# Patient Record
Sex: Female | Born: 1970
Health system: Southern US, Community
[De-identification: ages and names within clinical notes are randomized; demographics above are authoritative.]

## PROBLEM LIST (undated history)

## (undated) DIAGNOSIS — Z8669 Personal history of other diseases of the nervous system and sense organs: Secondary | ICD-10-CM

## (undated) DIAGNOSIS — F329 Major depressive disorder, single episode, unspecified: Secondary | ICD-10-CM

## (undated) DIAGNOSIS — F32A Depression, unspecified: Secondary | ICD-10-CM

## (undated) DIAGNOSIS — F419 Anxiety disorder, unspecified: Secondary | ICD-10-CM

## (undated) DIAGNOSIS — T7840XA Allergy, unspecified, initial encounter: Secondary | ICD-10-CM

## (undated) HISTORY — DX: Major depressive disorder, single episode, unspecified: F32.9

## (undated) HISTORY — DX: Depression, unspecified: F32.A

## (undated) HISTORY — PX: BASAL CELL CARCINOMA EXCISION: SHX1214

## (undated) HISTORY — DX: Allergy, unspecified, initial encounter: T78.40XA

## (undated) HISTORY — PX: WISDOM TOOTH EXTRACTION: SHX21

---

## 1996-11-20 HISTORY — PX: WISDOM TOOTH EXTRACTION: SHX21

## 2005-06-14 ENCOUNTER — Other Ambulatory Visit: Admission: RE | Admit: 2005-06-14 | Discharge: 2005-06-14 | Payer: Self-pay | Admitting: Family Medicine

## 2006-07-17 ENCOUNTER — Other Ambulatory Visit: Admission: RE | Admit: 2006-07-17 | Discharge: 2006-07-17 | Payer: Self-pay | Admitting: Family Medicine

## 2007-08-05 ENCOUNTER — Other Ambulatory Visit: Admission: RE | Admit: 2007-08-05 | Discharge: 2007-08-05 | Payer: Self-pay | Admitting: Family Medicine

## 2008-09-02 ENCOUNTER — Other Ambulatory Visit: Admission: RE | Admit: 2008-09-02 | Discharge: 2008-09-02 | Payer: Self-pay | Admitting: Family Medicine

## 2011-09-18 ENCOUNTER — Other Ambulatory Visit (HOSPITAL_COMMUNITY)
Admission: RE | Admit: 2011-09-18 | Discharge: 2011-09-18 | Disposition: A | Discharge status: Home / Self Care | Payer: BC Managed Care – PPO | Source: Ambulatory Visit | Attending: Family Medicine | Admitting: Family Medicine

## 2011-09-18 ENCOUNTER — Other Ambulatory Visit: Payer: Self-pay | Admitting: Family Medicine

## 2011-09-18 DIAGNOSIS — Z1159 Encounter for screening for other viral diseases: Secondary | ICD-10-CM | POA: Insufficient documentation

## 2011-09-18 DIAGNOSIS — Z124 Encounter for screening for malignant neoplasm of cervix: Secondary | ICD-10-CM | POA: Insufficient documentation

## 2014-03-12 ENCOUNTER — Telehealth: Payer: Self-pay

## 2014-03-12 NOTE — Telephone Encounter (Signed)
Unable to reach patient.  Invalid number.

## 2014-03-13 ENCOUNTER — Other Ambulatory Visit (HOSPITAL_COMMUNITY)
Admission: RE | Admit: 2014-03-13 | Discharge: 2014-03-13 | Disposition: A | Discharge status: Home / Self Care | Payer: BC Managed Care – PPO | Source: Ambulatory Visit | Attending: Family Medicine | Admitting: Family Medicine

## 2014-03-13 ENCOUNTER — Ambulatory Visit (INDEPENDENT_AMBULATORY_CARE_PROVIDER_SITE_OTHER): Payer: BC Managed Care – PPO | Admitting: Family Medicine

## 2014-03-13 ENCOUNTER — Encounter: Payer: Self-pay | Admitting: Family Medicine

## 2014-03-13 VITALS — BP 120/78 | HR 86 | Temp 98.1°F | Resp 16 | Ht 63.75 in | Wt 141.4 lb

## 2014-03-13 DIAGNOSIS — F341 Dysthymic disorder: Secondary | ICD-10-CM

## 2014-03-13 DIAGNOSIS — Z0000 Encounter for general adult medical examination without abnormal findings: Secondary | ICD-10-CM | POA: Insufficient documentation

## 2014-03-13 DIAGNOSIS — Z1231 Encounter for screening mammogram for malignant neoplasm of breast: Secondary | ICD-10-CM

## 2014-03-13 DIAGNOSIS — Z124 Encounter for screening for malignant neoplasm of cervix: Secondary | ICD-10-CM | POA: Insufficient documentation

## 2014-03-13 DIAGNOSIS — F329 Major depressive disorder, single episode, unspecified: Secondary | ICD-10-CM

## 2014-03-13 DIAGNOSIS — F32A Depression, unspecified: Secondary | ICD-10-CM

## 2014-03-13 DIAGNOSIS — Z01419 Encounter for gynecological examination (general) (routine) without abnormal findings: Secondary | ICD-10-CM

## 2014-03-13 DIAGNOSIS — F419 Anxiety disorder, unspecified: Secondary | ICD-10-CM

## 2014-03-13 DIAGNOSIS — Z1151 Encounter for screening for human papillomavirus (HPV): Secondary | ICD-10-CM | POA: Insufficient documentation

## 2014-03-13 MED ORDER — FLUOXETINE HCL 20 MG PO TABS: 20.0000 mg | ORAL_TABLET | Freq: Every day | ORAL | Status: DC

## 2014-03-13 NOTE — Progress Notes (Signed)
   Subjective:    Patient ID: Sierra Harris, female    DOB: Aug 24, 1971, 43 y.o.   MRN: [id number]  HPI New to establish.  [facility]([place] then [facility] [employer] Sigmund Hazel(Lisa Miller)  CPE- last CPE 2012.  Due to start mammos.   Review of Systems Patient reports no vision/ hearing changes, adenopathy,fever, weight change,  persistant/recurrent hoarseness , swallowing issues, chest pain, palpitations, edema, persistant/recurrent cough, hemoptysis, dyspnea (rest/exertional/paroxysmal nocturnal), gastrointestinal bleeding (melena, rectal bleeding), abdominal pain, significant heartburn, bowel changes, GU symptoms (dysuria, hematuria, incontinence), Gyn symptoms (abnormal  bleeding, pain),  syncope, focal weakness, memory loss, numbness & tingling, skin/hair/nail changes, abnormal bruising or bleeding.  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  Has supportive boyfriend.  Exercising regularly.    Objective:   Physical Exam  General Appearance:    Alert, cooperative, no distress, appears stated age  Head:    Normocephalic, without obvious abnormality, atraumatic  Eyes:    PERRL, conjunctiva/corneas clear, EOM's intact, fundi    benign, both eyes  Ears:    Normal TM's and external ear canals, both ears  Nose:   Nares normal, septum midline, mucosa normal, no drainage    or sinus tenderness  Throat:   Lips, mucosa, and tongue normal; teeth and gums normal  Neck:   Supple, symmetrical, trachea midline, no adenopathy;    Thyroid: no enlargement/tenderness/nodules  Back:     Symmetric, no curvature, ROM normal, no CVA tenderness  Lungs:     Clear to auscultation bilaterally, respirations unlabored  Chest Wall:    No tenderness or deformity   Heart:    Regular rate and rhythm, S1 and S2 normal, no murmur, rub   or gallop  Breast Exam:    No tenderness, masses, or nipple  abnormality  Abdomen:     Soft, non-tender, bowel sounds active all four quadrants,    no masses, no organomegaly  Genitalia:    External genitalia normal, cervix normal in appearance, no CMT, uterus in normal size and position, adnexa w/out mass or tenderness, mucosa pink and moist, no lesions or discharge present  Rectal:    Normal external appearance  Extremities:   Extremities normal, atraumatic, no cyanosis or edema  Pulses:   2+ and symmetric all extremities  Skin:   Skin color, texture, turgor normal, no rashes or lesions  Lymph nodes:   Cervical, supraclavicular, and axillary nodes normal  Neurologic:   CNII-XII intact, normal strength, sensation and reflexes    throughout          Assessment & Plan:

## 2014-03-13 NOTE — Assessment & Plan Note (Signed)
Pt's PE WNL w/ exception of anxiety/depression.  Refer for mammo.  Check labs.  Anticipatory guidance provided.

## 2014-03-13 NOTE — Assessment & Plan Note (Signed)
Pap collected. 

## 2014-03-13 NOTE — Assessment & Plan Note (Signed)
New.  Pt already managing w/ lifestyle modifications- exercise, speaking w/ supportive partner- open to idea of medication.  Will start low dose SSRI and monitor for improvement.  Pt expressed understanding and is in agreement w/ plan.

## 2014-03-13 NOTE — Progress Notes (Signed)
Pre visit review using our clinic review tool, if applicable. No additional management support is needed unless otherwise documented below in the visit note. 

## 2014-03-13 NOTE — Patient Instructions (Signed)
Follow up in 4-6 weeks to recheck mood Start the Prozac daily- if it makes you sleepy (odd effect but possible), take it at night We'll notify you of your lab results and make any changes if needed We'll call you to set up your mammo appt Call with any questions or concerns Welcome!  We're glad to have you!!

## 2014-03-13 NOTE — Addendum Note (Signed)
Addended by: Silvio PateHOMPSON, Klee Kolek D on: 03/13/2014 10:56 AM   Modules accepted: Orders

## 2014-03-16 ENCOUNTER — Other Ambulatory Visit (INDEPENDENT_AMBULATORY_CARE_PROVIDER_SITE_OTHER): Payer: BC Managed Care – PPO

## 2014-03-16 DIAGNOSIS — Z23 Encounter for immunization: Secondary | ICD-10-CM

## 2014-03-16 LAB — BASIC METABOLIC PANEL
BUN: 19 mg/dL (ref 6–23)
CALCIUM: 9 mg/dL (ref 8.4–10.5)
CO2: 26 meq/L (ref 19–32)
Chloride: 105 mEq/L (ref 96–112)
Creatinine, Ser: 0.8 mg/dL (ref 0.4–1.2)
GFR: 82.53 mL/min (ref >60.00)
Glucose, Bld: 86 mg/dL (ref 70–99)
Potassium: 4 mEq/L (ref 3.5–5.1)
SODIUM: 139 meq/L (ref 135–145)

## 2014-03-16 LAB — HEPATIC FUNCTION PANEL
ALK PHOS: 58 U/L (ref 39–117)
ALT: 12 U/L (ref 0–35)
AST: 16 U/L (ref 0–37)
Albumin: 4.3 g/dL (ref 3.5–5.2)
Bilirubin, Direct: 0 mg/dL (ref 0.0–0.3)
TOTAL PROTEIN: 7.4 g/dL (ref 6.0–8.3)
Total Bilirubin: 0.9 mg/dL (ref 0.3–1.2)

## 2014-03-16 LAB — TSH: TSH: 0.33 u[IU]/mL — AB (ref 0.35–5.50)

## 2014-03-16 LAB — CBC WITH DIFFERENTIAL/PLATELET
BASOS PCT: 0.5 % (ref 0.0–3.0)
Basophils Absolute: 0 10*3/uL (ref 0.0–0.1)
Eosinophils Absolute: 0.1 10*3/uL (ref 0.0–0.7)
Eosinophils Relative: 1.5 % (ref 0.0–5.0)
HCT: 41 % (ref 36.0–46.0)
HEMOGLOBIN: 13.9 g/dL (ref 12.0–15.0)
LYMPHS PCT: 32.3 % (ref 12.0–46.0)
Lymphs Abs: 1.7 10*3/uL (ref 0.7–4.0)
MCHC: 33.9 g/dL (ref 30.0–36.0)
MCV: 97.6 fl (ref 78.0–100.0)
MONOS PCT: 8 % (ref 3.0–12.0)
Monocytes Absolute: 0.4 10*3/uL (ref 0.1–1.0)
Neutro Abs: 3.1 10*3/uL (ref 1.4–7.7)
Neutrophils Relative %: 57.7 % (ref 43.0–77.0)
Platelets: 319 10*3/uL (ref 150.0–400.0)
RBC: 4.2 Mil/uL (ref 3.87–5.11)
RDW: 12.8 % (ref 11.5–14.6)
WBC: 5.4 10*3/uL (ref 4.5–10.5)

## 2014-03-16 LAB — LIPID PANEL
CHOL/HDL RATIO: 4
Cholesterol: 161 mg/dL (ref 0–200)
HDL: 45.5 mg/dL (ref >39.00)
LDL CALC: 98 mg/dL (ref 0–99)
Triglycerides: 88 mg/dL (ref 0.0–149.0)
VLDL: 17.6 mg/dL (ref 0.0–40.0)

## 2014-03-20 LAB — VITAMIN D 1,25 DIHYDROXY
Vitamin D 1, 25 (OH)2 Total: 59 pg/mL (ref 18–72)
Vitamin D2 1, 25 (OH)2: <8 pg/mL
Vitamin D3 1, 25 (OH)2: 59 pg/mL

## 2014-03-23 ENCOUNTER — Ambulatory Visit
Admission: RE | Admit: 2014-03-23 | Discharge: 2014-03-23 | Disposition: A | Discharge status: Home / Self Care | Payer: BC Managed Care – PPO | Source: Ambulatory Visit | Attending: Family Medicine | Admitting: Family Medicine

## 2014-03-23 DIAGNOSIS — Z1231 Encounter for screening mammogram for malignant neoplasm of breast: Secondary | ICD-10-CM

## 2014-03-24 ENCOUNTER — Other Ambulatory Visit: Payer: Self-pay | Admitting: Family Medicine

## 2014-03-24 DIAGNOSIS — R928 Other abnormal and inconclusive findings on diagnostic imaging of breast: Secondary | ICD-10-CM

## 2014-04-03 ENCOUNTER — Ambulatory Visit
Admission: RE | Admit: 2014-04-03 | Discharge: 2014-04-03 | Disposition: A | Discharge status: Home / Self Care | Payer: BC Managed Care – PPO | Source: Ambulatory Visit | Attending: Family Medicine | Admitting: Family Medicine

## 2014-04-03 DIAGNOSIS — R928 Other abnormal and inconclusive findings on diagnostic imaging of breast: Secondary | ICD-10-CM

## 2014-06-11 ENCOUNTER — Encounter: Payer: Self-pay | Admitting: Family Medicine

## 2014-06-12 ENCOUNTER — Other Ambulatory Visit: Payer: Self-pay | Admitting: Family Medicine

## 2014-06-12 MED ORDER — FLUOXETINE HCL 20 MG PO TABS: 20.0000 mg | ORAL_TABLET | Freq: Every day | ORAL | Status: DC

## 2014-06-12 NOTE — Telephone Encounter (Signed)
Pt last seen 03-13-14 Med filled #30 with 3  Do you want pt to have a follow up appt, before refills.   Pt states: Dr. Beverely Lowabori, The fluoxetine seems to be working well with minimal side effects. Will I have to schedule another appointment prior to refilling my next prescription? The bottle shows "refills require authorization". Thank you. Morrie SheldonAshley

## 2014-09-14 ENCOUNTER — Other Ambulatory Visit: Payer: Self-pay | Admitting: Family Medicine

## 2014-09-14 DIAGNOSIS — N6489 Other specified disorders of breast: Secondary | ICD-10-CM

## 2014-10-05 ENCOUNTER — Ambulatory Visit
Admission: RE | Admit: 2014-10-05 | Discharge: 2014-10-05 | Disposition: A | Discharge status: Home / Self Care | Payer: BC Managed Care – PPO | Source: Ambulatory Visit | Attending: Family Medicine | Admitting: Family Medicine

## 2014-10-05 ENCOUNTER — Other Ambulatory Visit: Payer: Self-pay | Admitting: Family Medicine

## 2014-10-05 DIAGNOSIS — N6489 Other specified disorders of breast: Secondary | ICD-10-CM

## 2015-01-18 ENCOUNTER — Telehealth: Payer: Self-pay | Admitting: Family Medicine

## 2015-01-18 ENCOUNTER — Other Ambulatory Visit: Payer: Self-pay | Admitting: General Practice

## 2015-01-18 MED ORDER — FLUOXETINE HCL 20 MG PO TABS
20.0000 mg | ORAL_TABLET | Freq: Every day | ORAL | Status: DC
Start: 2015-01-18 — End: 2015-07-09

## 2015-01-18 NOTE — Telephone Encounter (Signed)
Last OV 03/13/14, pt was advised at that appt to follow up in 4-6 weeks prozac last filled 06/12/14 #30 with 6

## 2015-01-18 NOTE — Telephone Encounter (Signed)
Needs to schedule CPE

## 2015-01-18 NOTE — Telephone Encounter (Signed)
Contacted PT to make CPE per Dr. Rennis Goldenabori's request for openings 3/2. PT will check with work and call us back.

## 2015-05-03 ENCOUNTER — Other Ambulatory Visit: Payer: Self-pay | Admitting: Family Medicine

## 2015-05-03 DIAGNOSIS — N6489 Other specified disorders of breast: Secondary | ICD-10-CM

## 2015-05-20 ENCOUNTER — Ambulatory Visit
Admission: RE | Admit: 2015-05-20 | Discharge: 2015-05-20 | Disposition: A | Discharge status: Home / Self Care | Payer: BLUE CROSS/BLUE SHIELD | Source: Ambulatory Visit | Attending: Family Medicine | Admitting: Family Medicine

## 2015-05-20 DIAGNOSIS — N6489 Other specified disorders of breast: Secondary | ICD-10-CM

## 2015-07-09 ENCOUNTER — Ambulatory Visit (INDEPENDENT_AMBULATORY_CARE_PROVIDER_SITE_OTHER): Payer: BLUE CROSS/BLUE SHIELD | Admitting: Family Medicine

## 2015-07-09 ENCOUNTER — Encounter: Payer: Self-pay | Admitting: Family Medicine

## 2015-07-09 VITALS — BP 110/74 | HR 82 | Temp 98.4°F | Resp 18 | Ht 64.0 in | Wt 159.1 lb

## 2015-07-09 DIAGNOSIS — Z0000 Encounter for general adult medical examination without abnormal findings: Secondary | ICD-10-CM

## 2015-07-09 DIAGNOSIS — R5383 Other fatigue: Secondary | ICD-10-CM | POA: Diagnosis not present

## 2015-07-09 DIAGNOSIS — Z01419 Encounter for gynecological examination (general) (routine) without abnormal findings: Secondary | ICD-10-CM

## 2015-07-09 LAB — BASIC METABOLIC PANEL
BUN: 15 mg/dL (ref 6–23)
CHLORIDE: 107 meq/L (ref 96–112)
CO2: 24 mEq/L (ref 19–32)
Calcium: 9.2 mg/dL (ref 8.4–10.5)
Creatinine, Ser: 0.75 mg/dL (ref 0.40–1.20)
GFR: 89.63 mL/min (ref >60.00)
Glucose, Bld: 95 mg/dL (ref 70–99)
POTASSIUM: 4.1 meq/L (ref 3.5–5.1)
SODIUM: 139 meq/L (ref 135–145)

## 2015-07-09 LAB — HEPATIC FUNCTION PANEL
ALT: 26 U/L (ref 0–35)
AST: 28 U/L (ref 0–37)
Albumin: 4.2 g/dL (ref 3.5–5.2)
Alkaline Phosphatase: 81 U/L (ref 39–117)
BILIRUBIN DIRECT: 0.1 mg/dL (ref 0.0–0.3)
BILIRUBIN TOTAL: 0.7 mg/dL (ref 0.2–1.2)
Total Protein: 7.1 g/dL (ref 6.0–8.3)

## 2015-07-09 LAB — CBC WITH DIFFERENTIAL/PLATELET
Basophils Absolute: 0 10*3/uL (ref 0.0–0.1)
Basophils Relative: 0.5 % (ref 0.0–3.0)
EOS PCT: 1 % (ref 0.0–5.0)
Eosinophils Absolute: 0.1 10*3/uL (ref 0.0–0.7)
HCT: 40.6 % (ref 36.0–46.0)
HEMOGLOBIN: 13.9 g/dL (ref 12.0–15.0)
LYMPHS ABS: 1.7 10*3/uL (ref 0.7–4.0)
Lymphocytes Relative: 32.7 % (ref 12.0–46.0)
MCHC: 34.2 g/dL (ref 30.0–36.0)
MCV: 95.7 fl (ref 78.0–100.0)
Monocytes Absolute: 0.4 10*3/uL (ref 0.1–1.0)
Monocytes Relative: 7.5 % (ref 3.0–12.0)
NEUTROS PCT: 58.3 % (ref 43.0–77.0)
Neutro Abs: 3 10*3/uL (ref 1.4–7.7)
Platelets: 292 10*3/uL (ref 150.0–400.0)
RBC: 4.25 Mil/uL (ref 3.87–5.11)
RDW: 12.8 % (ref 11.5–15.5)
WBC: 5.1 10*3/uL (ref 4.0–10.5)

## 2015-07-09 LAB — B12 AND FOLATE PANEL
Folate: 20.6 ng/mL (ref >5.9)
Vitamin B-12: 936 pg/mL — ABNORMAL HIGH (ref 211–911)

## 2015-07-09 LAB — LIPID PANEL
CHOL/HDL RATIO: 3
Cholesterol: 166 mg/dL (ref 0–200)
HDL: 49.3 mg/dL (ref >39.00)
LDL CALC: 102 mg/dL — AB (ref 0–99)
NONHDL: 116.22
Triglycerides: 73 mg/dL (ref 0.0–149.0)
VLDL: 14.6 mg/dL (ref 0.0–40.0)

## 2015-07-09 LAB — TSH: TSH: 0.6 u[IU]/mL (ref 0.35–4.50)

## 2015-07-09 LAB — VITAMIN D 25 HYDROXY (VIT D DEFICIENCY, FRACTURES): VITD: 33.89 ng/mL (ref 30.00–100.00)

## 2015-07-09 NOTE — Assessment & Plan Note (Signed)
Pt's PE WNL.  UTD on mammo, pap.  Check labs.  Anticipatory guidance provided.

## 2015-07-09 NOTE — Patient Instructions (Signed)
Follow up in 1 year or as needed We'll notify you of your lab results and make any changes if needed If the labs come back normal, we'll discuss starting a different medication to improve energy level/mood Keep up the good work on healthy diet and regular exercise- you can do it! Call with any questions or concerns Happy Belated Birthday!!!

## 2015-07-09 NOTE — Assessment & Plan Note (Signed)
New.  Unclear if this is metabolic or depression related.  Will check labs to r/o metabolic causes.  If labs normal, will plan to start Effexor.  Pt expressed understanding and is in agreement w/ plan.

## 2015-07-09 NOTE — Progress Notes (Signed)
Pre visit review using our clinic review tool, if applicable. No additional management support is needed unless otherwise documented below in the visit note. 

## 2015-07-09 NOTE — Progress Notes (Signed)
   Subjective:    Patient ID: Sierra Harris, female    DOB: Jan 02, 1971, 44 y.o.   MRN: [id number]  HPI CPE- UTD on mammo and pap.  + weight gain and fatigue.  Stopped Prozac b/c she was concerned it was a side effect of meds.  Got Flu Shot on 8/11 at [facility]   Review of Systems Patient reports no vision/ hearing changes, adenopathy,fever, weight change,  persistant/recurrent hoarseness , swallowing issues, chest pain, palpitations, edema, persistant/recurrent cough, hemoptysis, dyspnea (rest/exertional/paroxysmal nocturnal), gastrointestinal bleeding (melena, rectal bleeding), abdominal pain, significant heartburn, bowel changes, GU symptoms (dysuria, hematuria, incontinence), Gyn symptoms (abnormal  bleeding, pain),  syncope, focal weakness, memory loss, numbness & tingling, skin/hair/nail changes, abnormal bruising or bleeding, anxiety, or depression.     Objective:   Physical Exam General Appearance:    Alert, cooperative, no distress, appears stated age  Head:    Normocephalic, without obvious abnormality, atraumatic  Eyes:    PERRL, conjunctiva/corneas clear, EOM's intact, fundi    benign, both eyes  Ears:    Normal TM's and external ear canals, both ears  Nose:   Nares normal, septum midline, mucosa normal, no drainage    or sinus tenderness  Throat:   Lips, mucosa, and tongue normal; teeth and gums normal  Neck:   Supple, symmetrical, trachea midline, no adenopathy;    Thyroid: no enlargement/tenderness/nodules  Back:     Symmetric, no curvature, ROM normal, no CVA tenderness  Lungs:     Clear to auscultation bilaterally, respirations unlabored  Chest Wall:    No tenderness or deformity   Heart:    Regular rate and rhythm, S1 and S2 normal, no murmur, rub   or gallop  Breast Exam:    Deferred to mammo  Abdomen:     Soft, non-tender, bowel sounds active all four quadrants,    no masses, no organomegaly  Genitalia:    Deferred  Rectal:    Extremities:   Extremities normal,  atraumatic, no cyanosis or edema  Pulses:   2+ and symmetric all extremities  Skin:   Skin color, texture, turgor normal, no rashes or lesions  Lymph nodes:   Cervical, supraclavicular, and axillary nodes normal  Neurologic:   CNII-XII intact, normal strength, sensation and reflexes    throughout          Assessment & Plan:

## 2015-07-11 ENCOUNTER — Encounter: Payer: Self-pay | Admitting: Family Medicine

## 2015-07-12 MED ORDER — BUPROPION HCL ER (XL) 150 MG PO TB24: 150.0000 mg | ORAL_TABLET | Freq: Every day | ORAL | Status: DC

## 2015-09-22 ENCOUNTER — Encounter: Payer: Self-pay | Admitting: Family Medicine

## 2015-10-08 ENCOUNTER — Telehealth: Payer: Self-pay | Admitting: Family Medicine
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
## 2015-10-08 NOTE — Telephone Encounter (Signed)
Called pt back to advise. We discussed Saturday clinic then pt decided to wait to be seen in the office on Monday. Pt states that she will call back in later when she decides.

## 2015-10-08 NOTE — Telephone Encounter (Signed)
error 
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
## 2015-10-08 NOTE — Telephone Encounter (Signed)
Pt would need appt prior to xray in order for insurance to cover since she has not been evaluated

## 2015-10-11 ENCOUNTER — Ambulatory Visit (INDEPENDENT_AMBULATORY_CARE_PROVIDER_SITE_OTHER): Payer: BLUE CROSS/BLUE SHIELD | Admitting: Family

## 2015-10-11 ENCOUNTER — Encounter: Payer: Self-pay | Admitting: Family

## 2015-10-11 ENCOUNTER — Other Ambulatory Visit: Payer: Self-pay | Admitting: Family

## 2015-10-11 ENCOUNTER — Ambulatory Visit (HOSPITAL_BASED_OUTPATIENT_CLINIC_OR_DEPARTMENT_OTHER)
Admission: RE | Admit: 2015-10-11 | Discharge: 2015-10-11 | Disposition: A | Discharge status: Home / Self Care | Payer: BLUE CROSS/BLUE SHIELD | Source: Ambulatory Visit | Attending: Family | Admitting: Family

## 2015-10-11 VITALS — BP 120/78 | HR 83 | Temp 98.4°F | Resp 16 | Ht 64.0 in | Wt 152.4 lb

## 2015-10-11 DIAGNOSIS — R0781 Pleurodynia: Secondary | ICD-10-CM
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
## 2015-10-11 NOTE — Patient Instructions (Signed)
Please complete x ray on the first floor.  You may use motrin as needed for pain. Call if pain worsens or if it does not continue to improve.

## 2015-10-11 NOTE — Progress Notes (Signed)
Pre visit review using our clinic review tool, if applicable. No additional management support is needed unless otherwise documented below in the visit note. 

## 2015-10-12 ENCOUNTER — Telehealth: Payer: Self-pay | Admitting: *Deleted

## 2015-10-12 NOTE — Telephone Encounter (Signed)
-----   Message from Sandford CrazeMelissa O'Sullivan, NP sent at 10/11/2015  8:25 AM EST ----- X ray negative for fracture.

## 2015-10-12 NOTE — Telephone Encounter (Signed)
Attempted to reach pt by phone and advised her to see mychart message. Message sent.

## 2015-11-29 ENCOUNTER — Other Ambulatory Visit: Payer: Self-pay

## 2015-11-29 ENCOUNTER — Other Ambulatory Visit: Payer: Self-pay | Admitting: Family Medicine

## 2015-11-29 DIAGNOSIS — N6489 Other specified disorders of breast: Secondary | ICD-10-CM

## 2015-12-03 ENCOUNTER — Ambulatory Visit
Admission: RE | Admit: 2015-12-03 | Discharge: 2015-12-03 | Disposition: A | Discharge status: Home / Self Care | Payer: BLUE CROSS/BLUE SHIELD | Source: Ambulatory Visit | Attending: Family Medicine | Admitting: Family Medicine

## 2015-12-03 DIAGNOSIS — N6489 Other specified disorders of breast: Secondary | ICD-10-CM

## 2016-05-15 ENCOUNTER — Other Ambulatory Visit: Payer: Self-pay | Admitting: Family Medicine

## 2016-05-15 NOTE — Telephone Encounter (Signed)
Medication filled to pharmacy as requested.   

## 2016-05-29 ENCOUNTER — Other Ambulatory Visit: Payer: Self-pay | Admitting: Family Medicine

## 2016-05-29 DIAGNOSIS — N63 Unspecified lump in unspecified breast: Secondary | ICD-10-CM

## 2016-06-02 ENCOUNTER — Ambulatory Visit
Admission: RE | Admit: 2016-06-02 | Discharge: 2016-06-02 | Disposition: A | Discharge status: Home / Self Care | Payer: BLUE CROSS/BLUE SHIELD | Source: Ambulatory Visit | Attending: Family Medicine | Admitting: Family Medicine

## 2016-06-02 ENCOUNTER — Other Ambulatory Visit: Payer: Self-pay | Admitting: Family Medicine

## 2016-06-02 DIAGNOSIS — N63 Unspecified lump in unspecified breast: Secondary | ICD-10-CM

## 2016-07-18 DIAGNOSIS — Z23 Encounter for immunization: Secondary | ICD-10-CM | POA: Diagnosis not present

## 2016-09-15 ENCOUNTER — Other Ambulatory Visit: Payer: Self-pay | Admitting: Family Medicine

## 2016-10-15 ENCOUNTER — Other Ambulatory Visit: Payer: Self-pay | Admitting: Family Medicine

## 2016-11-08 ENCOUNTER — Other Ambulatory Visit: Payer: Self-pay | Admitting: Family Medicine

## 2016-12-02 ENCOUNTER — Other Ambulatory Visit: Payer: Self-pay | Admitting: Family Medicine

## 2016-12-13 ENCOUNTER — Other Ambulatory Visit: Payer: Self-pay | Admitting: Family Medicine

## 2016-12-15 ENCOUNTER — Encounter: Payer: Self-pay | Admitting: Family Medicine

## 2016-12-15 MED ORDER — BUPROPION HCL ER (XL) 150 MG PO TB24: ORAL_TABLET | ORAL | 1 refills | Status: DC

## 2017-02-09 ENCOUNTER — Other Ambulatory Visit: Payer: Self-pay | Admitting: Family Medicine

## 2017-02-15 ENCOUNTER — Ambulatory Visit (INDEPENDENT_AMBULATORY_CARE_PROVIDER_SITE_OTHER): Payer: BLUE CROSS/BLUE SHIELD | Admitting: Family Medicine

## 2017-02-15 ENCOUNTER — Encounter: Payer: Self-pay | Admitting: Family Medicine

## 2017-02-15 VITALS — BP 122/86 | HR 85 | Temp 98.6°F | Resp 16 | Ht 64.0 in | Wt 140.4 lb

## 2017-02-15 DIAGNOSIS — F418 Other specified anxiety disorders: Secondary | ICD-10-CM | POA: Diagnosis not present

## 2017-02-15 DIAGNOSIS — F329 Major depressive disorder, single episode, unspecified: Secondary | ICD-10-CM

## 2017-02-15 DIAGNOSIS — F419 Anxiety disorder, unspecified: Principal | ICD-10-CM

## 2017-02-15 DIAGNOSIS — F32A Depression, unspecified: Secondary | ICD-10-CM

## 2017-02-15 MED ORDER — BUPROPION HCL ER (XL) 150 MG PO TB24: ORAL_TABLET | ORAL | 6 refills | Status: DC

## 2017-02-15 MED ORDER — MOMETASONE FUROATE 50 MCG/ACT NA SUSP: 2.0000 | Freq: Every day | NASAL | 12 refills | Status: DC

## 2017-02-15 NOTE — Progress Notes (Signed)
Pre visit review using our clinic review tool, if applicable. No additional management support is needed unless otherwise documented below in the visit note. 
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
## 2017-02-15 NOTE — Patient Instructions (Signed)
Schedule your complete physical in 6 months Continue the Wellbutrin daily Call with any questions or concerns Happy Odis Lusteraster!!!

## 2017-02-15 NOTE — Assessment & Plan Note (Signed)
Chronic problem.  Currently well controlled.  Asymptomatic.  Continue wellbutrin at this time.  Pt expressed understanding and is in agreement w/ plan.

## 2017-03-08 ENCOUNTER — Encounter: Payer: Self-pay | Admitting: Family Medicine

## 2017-03-14 ENCOUNTER — Encounter: Payer: Self-pay | Admitting: Family Medicine

## 2017-04-26 ENCOUNTER — Encounter: Payer: BLUE CROSS/BLUE SHIELD | Admitting: Family Medicine

## 2017-06-22 ENCOUNTER — Other Ambulatory Visit: Payer: Self-pay | Admitting: Family Medicine

## 2017-06-22 DIAGNOSIS — Z1231 Encounter for screening mammogram for malignant neoplasm of breast: Secondary | ICD-10-CM

## 2017-06-25 ENCOUNTER — Ambulatory Visit
Admission: RE | Admit: 2017-06-25 | Discharge: 2017-06-25 | Disposition: A | Discharge status: Home / Self Care | Payer: BLUE CROSS/BLUE SHIELD | Source: Ambulatory Visit | Attending: Family Medicine | Admitting: Family Medicine

## 2017-06-25 DIAGNOSIS — Z1231 Encounter for screening mammogram for malignant neoplasm of breast: Secondary | ICD-10-CM

## 2017-06-27 ENCOUNTER — Other Ambulatory Visit: Payer: Self-pay | Admitting: Family Medicine

## 2017-06-27 ENCOUNTER — Ambulatory Visit: Payer: BLUE CROSS/BLUE SHIELD

## 2017-06-27 DIAGNOSIS — R928 Other abnormal and inconclusive findings on diagnostic imaging of breast: Secondary | ICD-10-CM

## 2017-06-29 ENCOUNTER — Other Ambulatory Visit: Payer: Self-pay | Admitting: Family Medicine

## 2017-06-29 ENCOUNTER — Ambulatory Visit
Admission: RE | Admit: 2017-06-29 | Discharge: 2017-06-29 | Disposition: A | Discharge status: Home / Self Care | Payer: BLUE CROSS/BLUE SHIELD | Source: Ambulatory Visit | Attending: Family Medicine | Admitting: Family Medicine

## 2017-06-29 DIAGNOSIS — R928 Other abnormal and inconclusive findings on diagnostic imaging of breast: Secondary | ICD-10-CM

## 2017-06-29 DIAGNOSIS — R921 Mammographic calcification found on diagnostic imaging of breast: Secondary | ICD-10-CM

## 2017-07-03 ENCOUNTER — Ambulatory Visit
Admission: RE | Admit: 2017-07-03 | Discharge: 2017-07-03 | Disposition: A | Discharge status: Home / Self Care | Payer: BLUE CROSS/BLUE SHIELD | Source: Ambulatory Visit | Attending: Family Medicine | Admitting: Family Medicine

## 2017-07-03 ENCOUNTER — Other Ambulatory Visit: Payer: Self-pay | Admitting: Family Medicine

## 2017-07-03 DIAGNOSIS — R921 Mammographic calcification found on diagnostic imaging of breast: Secondary | ICD-10-CM

## 2017-07-03 DIAGNOSIS — N6011 Diffuse cystic mastopathy of right breast: Secondary | ICD-10-CM | POA: Diagnosis not present

## 2017-07-09 ENCOUNTER — Ambulatory Visit (INDEPENDENT_AMBULATORY_CARE_PROVIDER_SITE_OTHER): Payer: BLUE CROSS/BLUE SHIELD | Admitting: Family Medicine

## 2017-07-09 ENCOUNTER — Encounter: Payer: Self-pay | Admitting: Family Medicine

## 2017-07-09 VITALS — BP 121/80 | HR 91 | Temp 98.0°F | Resp 16 | Ht 64.0 in | Wt 143.0 lb

## 2017-07-09 DIAGNOSIS — N6091 Unspecified benign mammary dysplasia of right breast: Secondary | ICD-10-CM | POA: Diagnosis not present

## 2017-07-09 DIAGNOSIS — F329 Major depressive disorder, single episode, unspecified: Secondary | ICD-10-CM | POA: Diagnosis not present

## 2017-07-09 DIAGNOSIS — F419 Anxiety disorder, unspecified: Secondary | ICD-10-CM

## 2017-07-09 DIAGNOSIS — F32A Depression, unspecified: Secondary | ICD-10-CM

## 2017-07-09 MED ORDER — ALPRAZOLAM 0.5 MG PO TABS: 0.5000 mg | ORAL_TABLET | Freq: Two times a day (BID) | ORAL | 3 refills | Status: DC | PRN

## 2017-07-09 NOTE — Progress Notes (Signed)
Pre visit review using our clinic review tool, if applicable. No additional management support is needed unless otherwise documented below in the visit note. 

## 2017-07-09 NOTE — Patient Instructions (Signed)
Follow up by phone or MyChart after your consultation in September and let me know how things are going Use the Alprazolam as needed for those panicked moments Call with any questions or concerns Hang in there!  You've got this!!!

## 2017-07-09 NOTE — Progress Notes (Signed)
   Subjective:    Patient ID: Sierra Harris, female    DOB: 10/05/1971, 46 y.o.   MRN: [id number]  HPI Atypical ductal hyperplasia R breast- pt had biopsy done last week and would like to discuss results, next steps, and her concerns  Anxiety- pt reports that with her recent diagnosis she has had a lot of trouble controlling her anxiety.  Difficulty falling asleep.  Having 'mild' panic attacks.  She and husband don't feel she needs a daily controlled medication but a rescue medication for as needed use.   Review of Systems For ROS see HPI     Objective:   Physical Exam  Constitutional: She is oriented to person, place, and time. She appears well-developed and well-nourished. No distress.  HENT:  Head: Normocephalic and atraumatic.  Neurological: She is alert and oriented to person, place, and time.  Skin: Skin is warm and dry.  Psychiatric: Her behavior is normal. Thought content normal.  Appropriately anxious when talking about the possibility of breast cancer  Vitals reviewed.         Assessment & Plan:

## 2017-07-10 NOTE — Assessment & Plan Note (Signed)
Deteriorated in setting of possible breast cancer dx.  She is not interested in daily controller medication but rather a rescue med to use as needed.  She is cutting back on her work hours- was working 60-70 but has decided to work no more than 40.  Applauded this decision.  Will continue to follow closely.

## 2017-07-10 NOTE — Assessment & Plan Note (Signed)
New.  Reviewed path report with pt.  Explained the terminology in the report as pt was confused- 'it's all greek to me'.  Discussed proposed next steps and how pt feels about them.  She would like to speed up the timeline- which is understandable, but with an 80% likelihood that this is benign, it will be difficult to be seen sooner.  Pt understood.  Total time spent w/ pt discussing dx and next steps- 42 minutes, >50% spent counseling.

## 2017-07-30 ENCOUNTER — Other Ambulatory Visit: Payer: Self-pay | Admitting: General Surgery

## 2017-07-30 ENCOUNTER — Encounter (HOSPITAL_BASED_OUTPATIENT_CLINIC_OR_DEPARTMENT_OTHER): Payer: Self-pay | Admitting: *Deleted

## 2017-07-30 DIAGNOSIS — R928 Other abnormal and inconclusive findings on diagnostic imaging of breast: Secondary | ICD-10-CM

## 2017-07-30 NOTE — Progress Notes (Signed)
Ensure pre surgery drink given with instructions to complete by 0545 dos, pt verbalized understanding. 

## 2017-08-07 ENCOUNTER — Ambulatory Visit (HOSPITAL_BASED_OUTPATIENT_CLINIC_OR_DEPARTMENT_OTHER)
Admission: RE | Admit: 2017-08-07 | Payer: BLUE CROSS/BLUE SHIELD | Source: Ambulatory Visit | Admitting: General Surgery

## 2017-08-07 HISTORY — DX: Personal history of other diseases of the nervous system and sense organs: Z86.69

## 2017-08-07 HISTORY — DX: Anxiety disorder, unspecified: F41.9

## 2017-08-07 SURGERY — RADIOACTIVE SEED GUIDED BREAST BIOPSY
Anesthesia: General | Site: Breast | Laterality: Right

## 2017-08-08 ENCOUNTER — Encounter (HOSPITAL_BASED_OUTPATIENT_CLINIC_OR_DEPARTMENT_OTHER): Payer: Self-pay | Admitting: *Deleted

## 2017-08-10 ENCOUNTER — Ambulatory Visit
Admission: RE | Admit: 2017-08-10 | Discharge: 2017-08-10 | Disposition: A | Discharge status: Home / Self Care | Payer: BLUE CROSS/BLUE SHIELD | Source: Ambulatory Visit | Attending: General Surgery | Admitting: General Surgery

## 2017-08-10 DIAGNOSIS — R928 Other abnormal and inconclusive findings on diagnostic imaging of breast: Secondary | ICD-10-CM

## 2017-08-10 DIAGNOSIS — N6081 Other benign mammary dysplasias of right breast: Secondary | ICD-10-CM | POA: Diagnosis not present

## 2017-08-10 NOTE — H&P (Signed)
Sierra Harris 07/30/2017 11:03 AM Location: [facility] Patient #: [id number] DOB: 02-10-1971 Single / Language: [name] / Race: Undefined Female   History of Present Illness Sierra Lint MD; 07/30/2017 11:38 AM) The patient is a 46 year old female who presents with a complaint of Breast problems. Patient is 46 year old female referred by Dr. Marisa Sprinkles for consultation regarding an abnormal right mammogram. She had calcifications seen on a screening mammogram. Diagnostic mammogram could not exclude disease as being malignant and a biopsy was performed showing atypical ductal hyperplasia with adenosis. She is here to discuss excision. She does not have any history of cancer. She has not had a breast biopsy before. She is a G0 P0. She had menarche at age 60. She did use hormonal contraception for a while.  Dx mammogram 06/29/17 EXAM: DIGITAL DIAGNOSTIC RIGHT MAMMOGRAM WITH CAD  COMPARISON: Previous exam(s).  ACR Breast Density Category b: There are scattered areas of fibroglandular density.  FINDINGS: Full field and magnification views of the right breast demonstrate a 3 cm group of slightly heterogeneous calcifications within the upper-outer right breast. No definite layering identified.  Mammographic images were processed with CAD.  IMPRESSION: Indeterminate calcifications within the upper-outer right breast. Tissue sampling recommended.  RECOMMENDATION: Stereotactic guided right breast biopsy, which will be scheduled.  I have discussed the findings and recommendations with the patient. Results were also provided in writing at the conclusion of the visit. If applicable, a reminder letter will be sent to the patient regarding the next appointment.  BI-RADS CATEGORY 4: Suspicious.  Pathology breast bx 07/03/17 Diagnosis Breast, right, needle core biopsy, upper outer, mid depth - ATYPICAL DUCTAL HYPERPLASIA WITH CALCIFICATIONS - FIBROCYSTIC AND COLUMNAR CELL  CHANGES - MICROGLANDULAR ADENOSIS - SEE COMMENT-   Past Surgical History (Sierra Harris, CMA; 07/30/2017 11:04 AM) Oral Surgery   Diagnostic Studies History (Sierra Harris, CMA; 07/30/2017 11:04 AM) Colonoscopy  never Mammogram  within last year Pap Smear  1-5 years ago  Allergies (Sierra Harris, CMA; 07/30/2017 11:04 AM) No Known Drug Allergies 07/30/2017  Medication History (Sierra Harris, CMA; 07/30/2017 11:08 AM) Bupropion HCl (  Tablet ER, Oral) Active. Mometasone Furoate (Inhalation) Specific strength unknown - Active. Stresstabs Energy (Oral) Active. Vitamin C (  Capsule, Oral) Active. Vitamin B-12 ( Tablet, Oral) Active. Medications Reconciled  Social History (Sierra Harris, [place]; 07/30/2017 11:04 AM) Alcohol use  Occasional alcohol use. Caffeine use  Carbonated beverages. No drug use  Tobacco use  Never smoker.  Family History (Sierra Harris, [place]; 07/30/2017 11:04 AM) Arthritis  Father. Colon Polyps  Mother. Hypertension  Mother. Ischemic Bowel Disease  Mother. Respiratory Condition  Mother.  Pregnancy / Birth History (Sierra Harris, [place]; 07/30/2017 11:04 AM) Age at menarche  11 years. Contraceptive History  Oral contraceptives. Gravida  0 Para  0 Regular periods   Other Problems (Sierra Harris, CMA; 07/30/2017 11:04 AM) Anxiety Disorder  Back Pain  Depression  Migraine Headache     Review of Systems (Sierra Harris CMA; 07/30/2017 11:04 AM) General Not Present- Appetite Loss, Chills, Fatigue, Fever, Night Sweats, Weight Gain and Weight Loss. Skin Not Present- Change in Wart/Mole, Dryness, Hives, Jaundice, New Lesions, Non-Healing Wounds, Rash and Ulcer. HEENT Present- Oral Ulcers, Seasonal Allergies and Sinus Pain. Not Present- Earache, Hearing Loss, Hoarseness, Nose Bleed, Ringing in the Ears, Sore Throat, Visual Disturbances, Wears glasses/contact lenses and Yellow Eyes. Respiratory Present- Snoring. Not Present- Bloody  sputum, Chronic Cough, Difficulty Breathing and Wheezing. Breast Not Present- Breast Mass, Breast Pain, Nipple Discharge and Skin Changes.  Cardiovascular Not Present- Chest Pain, Difficulty Breathing Lying Down, Leg Cramps, Palpitations, Rapid Heart Rate, Shortness of Breath and Swelling of Extremities. Gastrointestinal Not Present- Abdominal Pain, Bloating, Bloody Stool, Change in Bowel Habits, Chronic diarrhea, Constipation, Difficulty Swallowing, Excessive gas, Gets full quickly at meals, Hemorrhoids, Indigestion, Nausea, Rectal Pain and Vomiting. Female Genitourinary Not Present- Frequency, Nocturia, Painful Urination, Pelvic Pain and Urgency. Musculoskeletal Present- Back Pain and Joint Pain. Not Present- Joint Stiffness, Muscle Pain, Muscle Weakness and Swelling of Extremities. Neurological Present- Headaches. Not Present- Decreased Memory, Fainting, Numbness, Seizures, Tingling, Tremor, Trouble walking and Weakness. Psychiatric Present- Anxiety and Depression. Not Present- Bipolar, Change in Sleep Pattern, Fearful and Frequent crying. Endocrine Not Present- Cold Intolerance, Excessive Hunger, Hair Changes, Heat Intolerance, Hot flashes and New Diabetes. Hematology Not Present- Blood Thinners, Easy Bruising, Excessive bleeding, Gland problems, HIV and Persistent Infections.  Vitals (Sierra Harris CMA; 07/30/2017 11:08 AM) 07/30/2017 11:08 AM Weight: 119.13 lb Height: 64in Body Surface Area: 1.57 m Body Mass Index: 20.45 kg/m  Temp.: 98.7F(Oral)  Pulse: 80 (Regular)  BP: 114/82 (Sitting, Left Arm, Standard)       Physical Exam Sierra Lint MD; 07/30/2017 11:39 AM) General Mental Status-Alert. General Appearance-Consistent with stated age. Hydration-Well hydrated. Voice-Normal.  Head and Neck Head-normocephalic, atraumatic with no lesions or palpable masses. Trachea-midline. Thyroid Gland Characteristics - normal size and consistency.  Eye Eyeball -  Bilateral-Extraocular movements intact. Sclera/Conjunctiva - Bilateral-No scleral icterus.  Chest and Lung Exam Chest and lung exam reveals -quiet, even and easy respiratory effort with no use of accessory muscles and on auscultation, normal breath sounds, no adventitious sounds and normal vocal resonance. Inspection Chest Wall - Normal. Back - normal.  Breast Note: Minimal ptosis. No palpable masses in either breast. Breasts are symmetric bilaterally. No abnormal retraction or nipple discharge. No skin dimpling. No lymphadenopathy. No significant bruising.   Cardiovascular Cardiovascular examination reveals -normal heart sounds, regular rate and rhythm with no murmurs and normal pedal pulses bilaterally.  Abdomen Inspection Inspection of the abdomen reveals - No Hernias. Palpation/Percussion Palpation and Percussion of the abdomen reveal - Soft, Non Tender, No Rebound tenderness, No Rigidity (guarding) and No hepatosplenomegaly. Auscultation Auscultation of the abdomen reveals - Bowel sounds normal.  Neurologic Neurologic evaluation reveals -alert and oriented x 3 with no impairment of recent or remote memory. Mental Status-Normal.  Musculoskeletal Global Assessment -Note: no gross deformities.  Normal Exam - Left-Upper Extremity Strength Normal and Lower Extremity Strength Normal. Normal Exam - Right-Upper Extremity Strength Normal and Lower Extremity Strength Normal.  Lymphatic Head & Neck  General Head & Neck Lymphatics: Bilateral - Description - Normal. Axillary  General Axillary Region: Bilateral - Description - Normal. Tenderness - Non Tender. Femoral & Inguinal  Generalized Femoral & Inguinal Lymphatics: Bilateral - Description - No Generalized lymphadenopathy.    Assessment & Plan Sierra Lint MD; 07/30/2017 11:41 AM) ABNORMAL MAMMOGRAM OF RIGHT BREAST (R92.8) Impression: Patient has atypical ductal hyperplasia on core needle abscesses.  This is indication for excisional biopsy. I discussed seed localized biopsy with the patient. I discussed that there is no need to do an excisional biopsy of that there is risk of associated cancer. From the appearance of the calcifications she would be more likely to have DCIS and invasive cancer.  I reviewed surgical process. I discussed the risks of surgery including bleeding, infection, damage to adjacent structures, prolonged pain, possible need for additional surgeries, possible diagnosis of cancer, possible need for additional procedures such as seroma drainage. The patient  understands and wishes to proceed. I discussed recovery time and restrictions. We'll schedule this at the first available opportunity. Current Plans Pt Education - CSS Breast Biopsy Instructions (FLB): discussed with patient and provided information. Schedule for Surgery   Signed by Sierra Lint, MD (07/30/2017 11:41 AM)

## 2017-08-14 ENCOUNTER — Ambulatory Visit (HOSPITAL_BASED_OUTPATIENT_CLINIC_OR_DEPARTMENT_OTHER)
Admission: RE | Admit: 2017-08-14 | Discharge: 2017-08-14 | Disposition: A | Discharge status: Home / Self Care | Payer: BLUE CROSS/BLUE SHIELD | Source: Ambulatory Visit | Attending: General Surgery | Admitting: General Surgery

## 2017-08-14 ENCOUNTER — Encounter (HOSPITAL_BASED_OUTPATIENT_CLINIC_OR_DEPARTMENT_OTHER): Payer: Self-pay | Admitting: *Deleted

## 2017-08-14 ENCOUNTER — Ambulatory Visit (HOSPITAL_BASED_OUTPATIENT_CLINIC_OR_DEPARTMENT_OTHER): Payer: BLUE CROSS/BLUE SHIELD | Admitting: Certified Registered Nurse Anesthetist

## 2017-08-14 ENCOUNTER — Encounter (HOSPITAL_BASED_OUTPATIENT_CLINIC_OR_DEPARTMENT_OTHER)
Admission: RE | Disposition: A | Discharge status: Home / Self Care | Payer: Self-pay | Source: Ambulatory Visit | Attending: General Surgery

## 2017-08-14 ENCOUNTER — Ambulatory Visit
Admission: RE | Admit: 2017-08-14 | Discharge: 2017-08-14 | Disposition: A | Discharge status: Home / Self Care | Payer: BLUE CROSS/BLUE SHIELD | Source: Ambulatory Visit | Attending: General Surgery | Admitting: General Surgery

## 2017-08-14 DIAGNOSIS — F419 Anxiety disorder, unspecified: Secondary | ICD-10-CM | POA: Insufficient documentation

## 2017-08-14 DIAGNOSIS — N6021 Fibroadenosis of right breast: Secondary | ICD-10-CM | POA: Diagnosis not present

## 2017-08-14 DIAGNOSIS — N6081 Other benign mammary dysplasias of right breast: Secondary | ICD-10-CM | POA: Diagnosis not present

## 2017-08-14 DIAGNOSIS — Z79899 Other long term (current) drug therapy: Secondary | ICD-10-CM | POA: Diagnosis not present

## 2017-08-14 DIAGNOSIS — F418 Other specified anxiety disorders: Secondary | ICD-10-CM | POA: Diagnosis not present

## 2017-08-14 DIAGNOSIS — N6011 Diffuse cystic mastopathy of right breast: Secondary | ICD-10-CM | POA: Diagnosis not present

## 2017-08-14 DIAGNOSIS — R928 Other abnormal and inconclusive findings on diagnostic imaging of breast: Secondary | ICD-10-CM | POA: Diagnosis not present

## 2017-08-14 DIAGNOSIS — F329 Major depressive disorder, single episode, unspecified: Secondary | ICD-10-CM | POA: Insufficient documentation

## 2017-08-14 HISTORY — PX: RADIOACTIVE SEED GUIDED EXCISIONAL BREAST BIOPSY: SHX6490

## 2017-08-14 HISTORY — PX: BREAST EXCISIONAL BIOPSY: SUR124

## 2017-08-14 SURGERY — RADIOACTIVE SEED GUIDED BREAST BIOPSY
Anesthesia: General | Site: Breast | Laterality: Right

## 2017-08-14 MED ORDER — PROPOFOL 10 MG/ML IV BOLUS
INTRAVENOUS | Status: DC | PRN
  Administered 2017-08-14: 200 mg via INTRAVENOUS

## 2017-08-14 MED ORDER — ACETAMINOPHEN 500 MG PO TABS
ORAL_TABLET | ORAL | Status: AC
  Filled 2017-08-14: qty 2

## 2017-08-14 MED ORDER — ACETAMINOPHEN 500 MG PO TABS
1000.0000 mg | ORAL_TABLET | ORAL | Status: AC
  Administered 2017-08-14: 1000 mg via ORAL

## 2017-08-14 MED ORDER — SODIUM CHLORIDE 0.9% FLUSH: 3.0000 mL | INTRAVENOUS | Status: DC | PRN

## 2017-08-14 MED ORDER — ACETAMINOPHEN 650 MG RE SUPP: 650.0000 mg | RECTAL | Status: DC | PRN

## 2017-08-14 MED ORDER — CEFAZOLIN SODIUM-DEXTROSE 2-4 GM/100ML-% IV SOLN
INTRAVENOUS | Status: AC
  Filled 2017-08-14: qty 100

## 2017-08-14 MED ORDER — LIDOCAINE 2% (20 MG/ML) 5 ML SYRINGE
INTRAMUSCULAR | Status: AC
  Filled 2017-08-14: qty 5

## 2017-08-14 MED ORDER — ONDANSETRON HCL 4 MG PO TABS: 4.0000 mg | ORAL_TABLET | Freq: Three times a day (TID) | ORAL | 5 refills | Status: DC | PRN

## 2017-08-14 MED ORDER — OXYCODONE HCL 5 MG PO TABS: 5.0000 mg | ORAL_TABLET | ORAL | Status: DC | PRN

## 2017-08-14 MED ORDER — FENTANYL CITRATE (PF) 100 MCG/2ML IJ SOLN: 25.0000 ug | INTRAMUSCULAR | Status: DC | PRN

## 2017-08-14 MED ORDER — METOCLOPRAMIDE HCL 5 MG/ML IJ SOLN: 10.0000 mg | Freq: Once | INTRAMUSCULAR | Status: DC | PRN

## 2017-08-14 MED ORDER — CEFAZOLIN SODIUM-DEXTROSE 2-4 GM/100ML-% IV SOLN
2.0000 g | INTRAVENOUS | Status: AC
  Administered 2017-08-14: 2 g via INTRAVENOUS

## 2017-08-14 MED ORDER — BUPIVACAINE-EPINEPHRINE 0.5% -1:200000 IJ SOLN
INTRAMUSCULAR | Status: DC | PRN
  Administered 2017-08-14: 20 mL

## 2017-08-14 MED ORDER — BUPIVACAINE-EPINEPHRINE (PF) 0.5% -1:200000 IJ SOLN
INTRAMUSCULAR | Status: AC
  Filled 2017-08-14: qty 30

## 2017-08-14 MED ORDER — ONDANSETRON HCL 4 MG/2ML IJ SOLN
INTRAMUSCULAR | Status: AC
  Filled 2017-08-14: qty 2

## 2017-08-14 MED ORDER — SCOPOLAMINE 1 MG/3DAYS TD PT72: 1.0000 | MEDICATED_PATCH | Freq: Once | TRANSDERMAL | Status: DC | PRN

## 2017-08-14 MED ORDER — DEXAMETHASONE SODIUM PHOSPHATE 10 MG/ML IJ SOLN
INTRAMUSCULAR | Status: DC | PRN
  Administered 2017-08-14: 5 mg via INTRAVENOUS

## 2017-08-14 MED ORDER — CHLORHEXIDINE GLUCONATE CLOTH 2 % EX PADS: 6.0000 | MEDICATED_PAD | Freq: Once | CUTANEOUS | Status: DC

## 2017-08-14 MED ORDER — EPHEDRINE 5 MG/ML INJ
INTRAVENOUS | Status: AC
  Filled 2017-08-14: qty 10

## 2017-08-14 MED ORDER — LIDOCAINE HCL (CARDIAC) 20 MG/ML IV SOLN
INTRAVENOUS | Status: DC | PRN
  Administered 2017-08-14: 50 mg via INTRAVENOUS

## 2017-08-14 MED ORDER — LACTATED RINGERS IV SOLN
INTRAVENOUS | Status: DC
  Administered 2017-08-14: 07:00:00 via INTRAVENOUS

## 2017-08-14 MED ORDER — GABAPENTIN 300 MG PO CAPS
300.0000 mg | ORAL_CAPSULE | ORAL | Status: AC
  Administered 2017-08-14: 300 mg via ORAL

## 2017-08-14 MED ORDER — PROPOFOL 10 MG/ML IV BOLUS
INTRAVENOUS | Status: AC
  Filled 2017-08-14: qty 20

## 2017-08-14 MED ORDER — BUPIVACAINE-EPINEPHRINE 0.25% -1:200000 IJ SOLN
INTRAMUSCULAR | Status: AC
  Filled 2017-08-14: qty 1

## 2017-08-14 MED ORDER — FENTANYL CITRATE (PF) 100 MCG/2ML IJ SOLN
INTRAMUSCULAR | Status: AC
Start: 2017-08-14 — End: ?
  Filled 2017-08-14: qty 2

## 2017-08-14 MED ORDER — FENTANYL CITRATE (PF) 100 MCG/2ML IJ SOLN
50.0000 ug | INTRAMUSCULAR | Status: DC | PRN
  Administered 2017-08-14 (×2): 50 ug via INTRAVENOUS

## 2017-08-14 MED ORDER — MIDAZOLAM HCL 2 MG/2ML IJ SOLN
INTRAMUSCULAR | Status: AC
Start: 2017-08-14 — End: ?
  Filled 2017-08-14: qty 2

## 2017-08-14 MED ORDER — GABAPENTIN 300 MG PO CAPS
ORAL_CAPSULE | ORAL | Status: AC
  Filled 2017-08-14: qty 1

## 2017-08-14 MED ORDER — MIDAZOLAM HCL 2 MG/2ML IJ SOLN
1.0000 mg | INTRAMUSCULAR | Status: DC | PRN
  Administered 2017-08-14: 2 mg via INTRAVENOUS

## 2017-08-14 MED ORDER — ACETAMINOPHEN 325 MG PO TABS
650.0000 mg | ORAL_TABLET | ORAL | Status: DC | PRN
Start: 2017-08-14 — End: 2017-08-14

## 2017-08-14 MED ORDER — HYDROCODONE-ACETAMINOPHEN 7.5-325 MG PO TABS: 1.0000 | ORAL_TABLET | Freq: Once | ORAL | Status: DC | PRN

## 2017-08-14 MED ORDER — SODIUM CHLORIDE 0.9 % IV SOLN: 250.0000 mL | INTRAVENOUS | Status: DC | PRN

## 2017-08-14 MED ORDER — DEXAMETHASONE SODIUM PHOSPHATE 10 MG/ML IJ SOLN
INTRAMUSCULAR | Status: AC
  Filled 2017-08-14: qty 1

## 2017-08-14 MED ORDER — MEPERIDINE HCL 25 MG/ML IJ SOLN: 6.2500 mg | INTRAMUSCULAR | Status: DC | PRN

## 2017-08-14 MED ORDER — EPHEDRINE SULFATE 50 MG/ML IJ SOLN
INTRAMUSCULAR | Status: DC | PRN
  Administered 2017-08-14: 10 mg via INTRAVENOUS

## 2017-08-14 MED ORDER — OXYCODONE HCL 5 MG PO TABS: 5.0000 mg | ORAL_TABLET | Freq: Four times a day (QID) | ORAL | 0 refills | Status: DC | PRN

## 2017-08-14 MED ORDER — ONDANSETRON HCL 4 MG/2ML IJ SOLN
INTRAMUSCULAR | Status: DC | PRN
  Administered 2017-08-14: 4 mg via INTRAVENOUS

## 2017-08-14 MED ORDER — SODIUM CHLORIDE 0.9% FLUSH: 3.0000 mL | Freq: Two times a day (BID) | INTRAVENOUS | Status: DC

## 2017-08-14 MED ORDER — LIDOCAINE HCL (PF) 1 % IJ SOLN
INTRAMUSCULAR | Status: AC
  Filled 2017-08-14: qty 30

## 2017-08-14 SURGICAL SUPPLY — 51 items
ADH SKN CLS APL DERMABOND .7 (GAUZE/BANDAGES/DRESSINGS) ×1
BLADE SURG 10 STRL SS (BLADE) ×2 IMPLANT
BLADE SURG 15 STRL LF DISP TIS (BLADE) IMPLANT
BLADE SURG 15 STRL SS (BLADE)
CANISTER SUC SOCK COL 7IN (MISCELLANEOUS) IMPLANT
CANISTER SUCT 1200ML W/VALVE (MISCELLANEOUS) ×2 IMPLANT
CHLORAPREP W/TINT 26ML (MISCELLANEOUS) ×2 IMPLANT
CLIP VESOCCLUDE LG 6/CT (CLIP) ×2 IMPLANT
COVER BACK TABLE 60X90IN (DRAPES) ×2 IMPLANT
COVER MAYO STAND STRL (DRAPES) ×2 IMPLANT
COVER PROBE W GEL 5X96 (DRAPES) ×2 IMPLANT
DECANTER SPIKE VIAL GLASS SM (MISCELLANEOUS) IMPLANT
DERMABOND ADVANCED (GAUZE/BANDAGES/DRESSINGS) ×1
DERMABOND ADVANCED .7 DNX12 (GAUZE/BANDAGES/DRESSINGS) ×1 IMPLANT
DEVICE DUBIN W/COMP PLATE 8390 (MISCELLANEOUS) ×2 IMPLANT
DRAPE LAPAROSCOPIC ABDOMINAL (DRAPES) ×2 IMPLANT
DRAPE UTILITY XL STRL (DRAPES) ×2 IMPLANT
ELECT COATED BLADE 2.86 ST (ELECTRODE) ×2 IMPLANT
ELECT REM PT RETURN 9FT ADLT (ELECTROSURGICAL) ×2
ELECTRODE REM PT RTRN 9FT ADLT (ELECTROSURGICAL) ×1 IMPLANT
GAUZE SPONGE 4X4 12PLY STRL LF (GAUZE/BANDAGES/DRESSINGS) ×2 IMPLANT
GLOVE BIO SURGEON STRL SZ 6 (GLOVE) ×2 IMPLANT
GLOVE BIO SURGEON STRL SZ 6.5 (GLOVE) ×2 IMPLANT
GLOVE BIOGEL PI IND STRL 6.5 (GLOVE) ×1 IMPLANT
GLOVE BIOGEL PI INDICATOR 6.5 (GLOVE) ×3
GOWN STRL REUS W/ TWL LRG LVL3 (GOWN DISPOSABLE) ×1 IMPLANT
GOWN STRL REUS W/TWL 2XL LVL3 (GOWN DISPOSABLE) ×2 IMPLANT
GOWN STRL REUS W/TWL LRG LVL3 (GOWN DISPOSABLE) ×2
KIT MARKER MARGIN INK (KITS) ×2 IMPLANT
LIGHT WAVEGUIDE WIDE FLAT (MISCELLANEOUS) ×2 IMPLANT
NDL HYPO 25X1 1.5 SAFETY (NEEDLE) ×1 IMPLANT
NEEDLE HYPO 25X1 1.5 SAFETY (NEEDLE) ×2 IMPLANT
NS IRRIG 1000ML POUR BTL (IV SOLUTION) ×2 IMPLANT
PACK BASIN DAY SURGERY FS (CUSTOM PROCEDURE TRAY) ×2 IMPLANT
PENCIL BUTTON HOLSTER BLD 10FT (ELECTRODE) ×2 IMPLANT
SLEEVE SCD COMPRESS KNEE MED (MISCELLANEOUS) ×2 IMPLANT
SPONGE LAP 18X18 X RAY DECT (DISPOSABLE) ×2 IMPLANT
STAPLER VISISTAT 35W (STAPLE) IMPLANT
STRIP CLOSURE SKIN 1/2X4 (GAUZE/BANDAGES/DRESSINGS) ×2 IMPLANT
SUT MON AB 4-0 PC3 18 (SUTURE) ×2 IMPLANT
SUT SILK 2 0 SH (SUTURE) IMPLANT
SUT VIC AB 2-0 SH 18 (SUTURE) IMPLANT
SUT VIC AB 3-0 SH 27 (SUTURE) ×2
SUT VIC AB 3-0 SH 27X BRD (SUTURE) ×1 IMPLANT
SUT VICRYL 3-0 CR8 SH (SUTURE) IMPLANT
SYR BULB 3OZ (MISCELLANEOUS) ×2 IMPLANT
SYR CONTROL 10ML LL (SYRINGE) ×2 IMPLANT
TOWEL OR 17X24 6PK STRL BLUE (TOWEL DISPOSABLE) ×2 IMPLANT
TOWEL OR NON WOVEN STRL DISP B (DISPOSABLE) IMPLANT
TUBE CONNECTING 20X1/4 (TUBING) ×2 IMPLANT
YANKAUER SUCT BULB TIP NO VENT (SUCTIONS) ×2 IMPLANT

## 2017-08-14 NOTE — Op Note (Signed)
Right Breast Radioactive seed localized excisional biopsy  Indications: This patient presents with history of abnormal right mammogram and discordant lesion (ADH) on core needle biopsy  Pre-operative Diagnosis: abnormal right mammogram  Post-operative Diagnosis: Same  Surgeon: Stark Klein   Anesthesia: General endotracheal anesthesia  ASA Class: 2  Procedure Details  The patient was seen in the Holding Room. The risks, benefits, complications, treatment options, and expected outcomes were discussed with the patient. The possibilities of bleeding, infection, the need for additional procedures, failure to diagnose a condition, and creating a complication requiring transfusion or operation were discussed with the patient. The patient concurred with the proposed plan, giving informed consent.  The site of surgery properly noted/marked. The patient was taken to Operating Room # 5, identified, and the procedure verified as Right Breast seed localized excisional biopsy. A Time Out was held and the above information confirmed.  The right breast and chest were prepped and draped in standard fashion. The excision was performed by creating an axillary incision near the previously placed radioactive seed.  Dissection was carried down to around the point of maximum signal intensity. The cautery was used to perform the dissection.  Hemostasis was achieved with cautery. The cavity was marked with 1 clip.   The specimen was inked with the margin marker paint kit.    Specimen radiography confirmed inclusion of the mammographic lesion, the clip, and the seed.  The background signal in the breast was zero.  The wound was irrigated and closed with 3-0 vicryl in layers and 4-0 monocryl subcuticular suture.      Sterile dressings were applied. At the end of the operation, all sponge, instrument, and needle counts were correct.  Findings: grossly clear surgical margins and no adenopathy  Estimated Blood Loss:   min         Specimens: right breast tissue.         Complications:  None; patient tolerated the procedure well.         Disposition: PACU - hemodynamically stable.         Condition: stable

## 2017-08-14 NOTE — Interval H&P Note (Signed)
History and Physical Interval Note:  08/14/2017 7:39 AM  Sierra Harris  has presented today for surgery, with the diagnosis of RIGHT BREAST ABNORMAL MAMMOGRAM  The various methods of treatment have been discussed with the patient and family. After consideration of risks, benefits and other options for treatment, the patient has consented to  Procedure(s): RADIOACTIVE SEED GUIDED EXCISIONAL BREAST BIOPSY (Right) as a surgical intervention .  The patient's history has been reviewed, patient examined, no change in status, stable for surgery.  I have reviewed the patient's chart and labs.  Questions were answered to the patient's satisfaction.     Jeanifer Halliday

## 2017-08-14 NOTE — Anesthesia Procedure Notes (Signed)
Procedure Name: LMA Insertion Date/Time: 08/14/2017 7:52 AM Performed by: Cleda Clarks Pre-anesthesia Checklist: Patient identified, Emergency Drugs available, Suction available and Patient being monitored Patient Re-evaluated:Patient Re-evaluated prior to induction Oxygen Delivery Method: Circle system utilized Preoxygenation: Pre-oxygenation with 100% oxygen Induction Type: IV induction Ventilation: Mask ventilation without difficulty LMA: LMA inserted LMA Size: 4.0 Number of attempts: 1 Placement Confirmation: positive ETCO2 and breath sounds checked- equal and bilateral Tube secured with: Tape Dental Injury: Teeth and Oropharynx as per pre-operative assessment

## 2017-08-14 NOTE — Anesthesia Postprocedure Evaluation (Signed)
Anesthesia Post Note  Patient: Sierra Harris  Procedure(s) Performed: Procedure(s) (LRB): RADIOACTIVE SEED GUIDED EXCISIONAL BREAST BIOPSY (Right)     Patient location during evaluation: PACU Anesthesia Type: General Level of consciousness: awake and alert Pain management: pain level controlled Vital Signs Assessment: post-procedure vital signs reviewed and stable Respiratory status: spontaneous breathing, nonlabored ventilation and respiratory function stable Cardiovascular status: blood pressure returned to baseline and stable Postop Assessment: no apparent nausea or vomiting Anesthetic complications: no    Last Vitals:  Vitals:   08/14/17 0930 08/14/17 0933  BP: 125/77   Pulse: 98 99  Resp: 15 16  Temp:    SpO2: 99% 99%    Last Pain:  Vitals:   08/14/17 0915  TempSrc:   PainSc: 0-No pain                 Laryn Venning A.

## 2017-08-14 NOTE — Anesthesia Preprocedure Evaluation (Signed)
Anesthesia Evaluation  Patient identified by MRN, date of birth, ID band  Reviewed: Allergy & Precautions, NPO status , Patient's Chart, lab work & pertinent test results  Airway Mallampati: II  TM Distance: >3 FB Neck ROM: Full    Dental no notable dental hx. (+) Teeth Intact   Pulmonary neg pulmonary ROS,    Pulmonary exam normal breath sounds clear to auscultation       Cardiovascular negative cardio ROS Normal cardiovascular exam Rhythm:Regular Rate:Normal     Neuro/Psych PSYCHIATRIC DISORDERS Anxiety Depression negative neurological ROS     GI/Hepatic negative GI ROS, Neg liver ROS,   Endo/Other  Abnormal mammogram  Renal/GU negative Renal ROS  negative genitourinary   Musculoskeletal negative musculoskeletal ROS (+)   Abdominal   Peds  Hematology negative hematology ROS (+)   Anesthesia Other Findings   Reproductive/Obstetrics                             Anesthesia Physical Anesthesia Plan  ASA: II  Anesthesia Plan: General   Post-op Pain Management:    Induction: Intravenous  PONV Risk Score and Plan: 3 and Ondansetron, Dexamethasone, Midazolam and Propofol infusion  Airway Management Planned: LMA  Additional Equipment:   Intra-op Plan:   Post-operative Plan: Extubation in OR  Informed Consent: I have reviewed the patients History and Physical, chart, labs and discussed the procedure including the risks, benefits and alternatives for the proposed anesthesia with the patient or authorized representative who has indicated his/her understanding and acceptance.   Dental advisory given  Plan Discussed with: CRNA, Surgeon and Anesthesiologist  Anesthesia Plan Comments:         Anesthesia Quick Evaluation

## 2017-08-14 NOTE — Transfer of Care (Signed)
Immediate Anesthesia Transfer of Care Note  Patient: Sierra Harris  Procedure(s) Performed: Procedure(s): RADIOACTIVE SEED GUIDED EXCISIONAL BREAST BIOPSY (Right)  Patient Location: PACU  Anesthesia Type:General  Level of Consciousness: awake, alert  and oriented  Airway & Oxygen Therapy: Patient Spontanous Breathing and Patient connected to face mask oxygen  Post-op Assessment: Report given to RN and Post -op Vital signs reviewed and stable  Post vital signs: Reviewed and stable  Last Vitals:  Vitals:   08/14/17 0639  BP: 128/73  Pulse: 89  Resp: 20  Temp: 36.9 C  SpO2: 100%    Last Pain:  Vitals:   08/14/17 0639  TempSrc: Oral         Complications: No apparent anesthesia complications

## 2017-08-14 NOTE — Discharge Instructions (Addendum)
   [facility] Office Phone Number [phone]  BREAST BIOPSY/ PARTIAL MASTECTOMY: POST OP INSTRUCTIONS  Always review your discharge instruction sheet given to you by the facility where your surgery was performed.  IF YOU HAVE DISABILITY OR FAMILY LEAVE FORMS, YOU MUST BRING THEM TO THE OFFICE FOR PROCESSING.  DO NOT GIVE THEM TO YOUR DOCTOR.  1. A prescription for pain medication may be given to you upon discharge.  Take your pain medication as prescribed, if needed.  If narcotic pain medicine is not needed, then you may take acetaminophen (Tylenol) or ibuprofen (Advil) as needed. 2. Take your usually prescribed medications unless otherwise directed 3. If you need a refill on your pain medication, please contact your pharmacy.  They will contact our office to request authorization.  Prescriptions will not be filled after 5pm or on week-ends. 4. You should eat very light the first 24 hours after surgery, such as soup, crackers, pudding, etc.  Resume your normal diet the day after surgery. 5. Most patients will experience some swelling and bruising in the breast.  Ice packs and a good support bra will help.  Swelling and bruising can take several days to resolve.  6. It is common to experience some constipation if taking pain medication after surgery.  Increasing fluid intake and taking a stool softener will usually help or prevent this problem from occurring.  A mild laxative (Milk of Magnesia or Miralax) should be taken according to package directions if there are no bowel movements after 48 hours. 7. Unless discharge instructions indicate otherwise, you may remove your bandages 48 hours after surgery, and you may shower at that time.  You may have steri-strips (small skin tapes) in place directly over the incision.  These strips should be left on the skin for 7-10 days.   Any sutures or staples will be removed at the office during your follow-up visit. 8. ACTIVITIES:  You may  resume regular daily activities (gradually increasing) beginning the next day.  Wearing a good support bra or sports bra (or the breast binder) minimizes pain and swelling.  You may have sexual intercourse when it is comfortable. a. You may drive when you no longer are taking prescription pain medication, you can comfortably wear a seatbelt, and you can safely maneuver your car and apply brakes. b. RETURN TO WORK:  __________1 week_______________ 9. You should see your doctor in the office for a follow-up appointment approximately two weeks after your surgery.  Your doctor's nurse will typically make your follow-up appointment when she calls you with your pathology report.  Expect your pathology report 2-3 business days after your surgery.  You may call to check if you do not hear from us after three days.   WHEN TO CALL YOUR DOCTOR: 1. Fever over 101.0 2. Nausea and/or vomiting. 3. Extreme swelling or bruising. 4. Continued bleeding from incision. 5. Increased pain, redness, or drainage from the incision.  The clinic staff is available to answer your questions during regular business hours.  Please don't hesitate to call and ask to speak to one of the nurses for clinical concerns.  If you have a medical emergency, go to the nearest emergency room or call 911.  A surgeon from [facility] is always on call at the hospital.  For further questions, please visit [email or web address]     Post Anesthesia Home Care Instructions  Activity: Get plenty of rest for the remainder of the day. A responsible individual must stay   with you for 24 hours following the procedure.  For the next 24 hours, DO NOT: -Drive a car -Operate machinery -Drink alcoholic beverages -Take any medication unless instructed by your physician -Make any legal decisions or sign important papers.  Meals: Start with liquid foods such as gelatin or soup. Progress to regular foods as tolerated. Avoid greasy,  spicy, heavy foods. If nausea and/or vomiting occur, drink only clear liquids until the nausea and/or vomiting subsides. Call your physician if vomiting continues.  Special Instructions/Symptoms: Your throat may feel dry or sore from the anesthesia or the breathing tube placed in your throat during surgery. If this causes discomfort, gargle with warm salt water. The discomfort should disappear within 24 hours.  If you had a scopolamine patch placed behind your ear for the management of post- operative nausea and/or vomiting:  1. The medication in the patch is effective for 72 hours, after which it should be removed.  Wrap patch in a tissue and discard in the trash. Wash hands thoroughly with soap and water. 2. You may remove the patch earlier than 72 hours if you experience unpleasant side effects which may include dry mouth, dizziness or visual disturbances. 3. Avoid touching the patch. Wash your hands with soap and water after contact with the patch.    Call your surgeon if you experience:   1.  Fever over 101.0. 2.  Inability to urinate. 3.  Nausea and/or vomiting. 4.  Extreme swelling or bruising at the surgical site. 5.  Continued bleeding from the incision. 6.  Increased pain, redness or drainage from the incision. 7.  Problems related to your pain medication. 8.  Any problems and/or concerns  

## 2017-08-15 NOTE — Progress Notes (Signed)
Please let patient know that there is no cancer in the specimen.

## 2017-08-16 ENCOUNTER — Encounter (HOSPITAL_BASED_OUTPATIENT_CLINIC_OR_DEPARTMENT_OTHER): Payer: Self-pay | Admitting: General Surgery

## 2017-08-22 ENCOUNTER — Other Ambulatory Visit: Payer: Self-pay | Admitting: Family Medicine

## 2017-10-04 ENCOUNTER — Other Ambulatory Visit (HOSPITAL_COMMUNITY)
Admission: RE | Admit: 2017-10-04 | Discharge: 2017-10-04 | Disposition: A | Discharge status: Home / Self Care | Payer: BLUE CROSS/BLUE SHIELD | Source: Ambulatory Visit | Attending: Family Medicine | Admitting: Family Medicine

## 2017-10-04 ENCOUNTER — Other Ambulatory Visit: Payer: Self-pay

## 2017-10-04 ENCOUNTER — Ambulatory Visit (INDEPENDENT_AMBULATORY_CARE_PROVIDER_SITE_OTHER): Payer: BLUE CROSS/BLUE SHIELD | Admitting: Family Medicine

## 2017-10-04 ENCOUNTER — Encounter: Payer: Self-pay | Admitting: Family Medicine

## 2017-10-04 VITALS — BP 118/79 | HR 78 | Temp 98.1°F | Resp 16 | Ht 64.0 in | Wt 145.2 lb

## 2017-10-04 DIAGNOSIS — E7800 Pure hypercholesterolemia, unspecified: Secondary | ICD-10-CM

## 2017-10-04 DIAGNOSIS — Z124 Encounter for screening for malignant neoplasm of cervix: Secondary | ICD-10-CM | POA: Insufficient documentation

## 2017-10-04 DIAGNOSIS — Z01419 Encounter for gynecological examination (general) (routine) without abnormal findings: Secondary | ICD-10-CM

## 2017-10-04 NOTE — Assessment & Plan Note (Signed)
Pt's PE WNL.  Pap collected.  UTD on mammo.  UTD on Tdap, flu.  Check labs.  Anticipatory guidance provided.

## 2017-10-04 NOTE — Patient Instructions (Signed)
Follow up in 1 year or as needed We'll notify you of your lab results and make any changes if needed Keep up the good work on healthy diet and regular exercise- you look great! Call with any questions or concerns Happy Thanksgiving!!!

## 2017-10-04 NOTE — Progress Notes (Signed)
   Subjective:    Patient ID: Sierra Harris, female    DOB: 09-29-1971, 46 y.o.   MRN: [id number]  HPI CPE- UTD on mammo, due for pap.  No concerns today.   Review of Systems Patient reports no vision/ hearing changes, adenopathy,fever, weight change,  persistant/recurrent hoarseness , swallowing issues, chest pain, palpitations, edema, persistant/recurrent cough, hemoptysis, dyspnea (rest/exertional/paroxysmal nocturnal), gastrointestinal bleeding (melena, rectal bleeding), abdominal pain, significant heartburn, bowel changes, GU symptoms (dysuria, hematuria, incontinence), Gyn symptoms (abnormal  bleeding, pain),  syncope, focal weakness, memory loss, numbness & tingling, skin/hair/nail changes, abnormal bruising or bleeding, anxiety, or depression.     Objective:   Physical Exam  General Appearance:    Alert, cooperative, no distress, appears stated age  Head:    Normocephalic, without obvious abnormality, atraumatic  Eyes:    PERRL, conjunctiva/corneas clear, EOM's intact, fundi    benign, both eyes  Ears:    Normal TM's and external ear canals, both ears  Nose:   Nares normal, septum midline, mucosa normal, no drainage    or sinus tenderness  Throat:   Lips, mucosa, and tongue normal; teeth and gums normal  Neck:   Supple, symmetrical, trachea midline, no adenopathy;    Thyroid: no enlargement/tenderness/nodules  Back:     Symmetric, no curvature, ROM normal, no CVA tenderness  Lungs:     Clear to auscultation bilaterally, respirations unlabored  Chest Wall:    No tenderness or deformity   Heart:    Regular rate and rhythm, S1 and S2 normal, no murmur, rub   or gallop  Breast Exam:    Deferred to recent surgery/mammo  Abdomen:     Soft, non-tender, bowel sounds active all four quadrants,    no masses, no organomegaly  Genitalia:    External genitalia normal, cervix normal in appearance, no CMT, uterus in normal size and position, adnexa w/out mass or tenderness, mucosa pink and  moist, no lesions or discharge present  Rectal:    Normal external appearance  Extremities:   Extremities normal, atraumatic, no cyanosis or edema  Pulses:   2+ and symmetric all extremities  Skin:   Skin color, texture, turgor normal, no rashes or lesions  Lymph nodes:   Cervical, supraclavicular, and axillary nodes normal  Neurologic:   CNII-XII intact, normal strength, sensation and reflexes    throughout          Assessment & Plan:

## 2017-10-04 NOTE — Assessment & Plan Note (Signed)
Pap collected. 

## 2017-10-05 ENCOUNTER — Encounter: Payer: Self-pay | Admitting: General Practice

## 2017-10-05 LAB — LIPID PANEL
CHOL/HDL RATIO: 2.9 (calc) (ref <5.0)
Cholesterol: 174 mg/dL (ref <200)
HDL: 59 mg/dL (ref >50)
LDL Cholesterol (Calc): 95 mg/dL (calc)
NON-HDL CHOLESTEROL (CALC): 115 mg/dL (ref <130)
TRIGLYCERIDES: 102 mg/dL (ref <150)

## 2017-10-05 LAB — CBC WITH DIFFERENTIAL/PLATELET
BASOS ABS: 47 {cells}/uL (ref 0–200)
BASOS PCT: 0.6 %
EOS ABS: 71 {cells}/uL (ref 15–500)
Eosinophils Relative: 0.9 %
HCT: 38.1 % (ref 35.0–45.0)
HEMOGLOBIN: 13.5 g/dL (ref 11.7–15.5)
Lymphs Abs: 2323 cells/uL (ref 850–3900)
MCH: 32.4 pg (ref 27.0–33.0)
MCHC: 35.4 g/dL (ref 32.0–36.0)
MCV: 91.4 fL (ref 80.0–100.0)
MPV: 10.5 fL (ref 7.5–12.5)
Monocytes Relative: 8.3 %
Neutro Abs: 4803 cells/uL (ref 1500–7800)
Neutrophils Relative %: 60.8 %
Platelets: 286 10*3/uL (ref 140–400)
RBC: 4.17 10*6/uL (ref 3.80–5.10)
RDW: 11.5 % (ref 11.0–15.0)
TOTAL LYMPHOCYTE: 29.4 %
WBC mixed population: 656 cells/uL (ref 200–950)
WBC: 7.9 10*3/uL (ref 3.8–10.8)

## 2017-10-05 LAB — TSH: TSH: 0.68 m[IU]/L

## 2017-10-05 LAB — HEPATIC FUNCTION PANEL
AG RATIO: 2 (calc) (ref 1.0–2.5)
ALKALINE PHOSPHATASE (APISO): 71 U/L (ref 33–115)
ALT: 15 U/L (ref 6–29)
AST: 17 U/L (ref 10–35)
Albumin: 4.6 g/dL (ref 3.6–5.1)
BILIRUBIN INDIRECT: 1.1 mg/dL (ref 0.2–1.2)
Bilirubin, Direct: 0.3 mg/dL — ABNORMAL HIGH (ref 0.0–0.2)
Globulin: 2.3 g/dL (calc) (ref 1.9–3.7)
TOTAL PROTEIN: 6.9 g/dL (ref 6.1–8.1)
Total Bilirubin: 1.4 mg/dL — ABNORMAL HIGH (ref 0.2–1.2)

## 2017-10-05 LAB — BASIC METABOLIC PANEL
BUN: 15 mg/dL (ref 7–25)
CALCIUM: 9.2 mg/dL (ref 8.6–10.2)
CHLORIDE: 104 mmol/L (ref 98–110)
CO2: 24 mmol/L (ref 20–32)
Creat: 0.88 mg/dL (ref 0.50–1.10)
GLUCOSE: 83 mg/dL (ref 65–99)
Potassium: 3.6 mmol/L (ref 3.5–5.3)
Sodium: 140 mmol/L (ref 135–146)

## 2017-10-09 ENCOUNTER — Encounter: Payer: Self-pay | Admitting: General Practice

## 2017-10-09 LAB — CYTOLOGY - PAP
Diagnosis: NEGATIVE
HPV: NOT DETECTED

## 2018-02-08 ENCOUNTER — Other Ambulatory Visit: Payer: Self-pay | Admitting: Family Medicine

## 2018-03-08 ENCOUNTER — Other Ambulatory Visit: Payer: Self-pay | Admitting: Family Medicine

## 2018-08-14 ENCOUNTER — Other Ambulatory Visit: Payer: Self-pay | Admitting: Family Medicine

## 2018-08-14 DIAGNOSIS — Z1231 Encounter for screening mammogram for malignant neoplasm of breast: Secondary | ICD-10-CM

## 2018-09-01 ENCOUNTER — Other Ambulatory Visit: Payer: Self-pay | Admitting: Family Medicine

## 2018-09-10 ENCOUNTER — Ambulatory Visit
Admission: RE | Admit: 2018-09-10 | Discharge: 2018-09-10 | Disposition: A | Discharge status: Home / Self Care | Payer: BLUE CROSS/BLUE SHIELD | Source: Ambulatory Visit | Attending: Family Medicine | Admitting: Family Medicine

## 2018-09-10 DIAGNOSIS — Z1231 Encounter for screening mammogram for malignant neoplasm of breast: Secondary | ICD-10-CM | POA: Diagnosis not present

## 2018-10-04 ENCOUNTER — Other Ambulatory Visit: Payer: Self-pay | Admitting: Family Medicine

## 2018-10-04 ENCOUNTER — Encounter: Payer: Self-pay | Admitting: Emergency Medicine

## 2018-10-10 ENCOUNTER — Ambulatory Visit: Payer: BLUE CROSS/BLUE SHIELD | Admitting: Family Medicine

## 2018-10-10 ENCOUNTER — Encounter: Payer: Self-pay | Admitting: Family Medicine

## 2018-10-10 ENCOUNTER — Other Ambulatory Visit: Payer: Self-pay

## 2018-10-10 VITALS — BP 112/82 | HR 78 | Temp 98.3°F | Resp 16 | Ht 64.0 in | Wt 144.4 lb

## 2018-10-10 DIAGNOSIS — F329 Major depressive disorder, single episode, unspecified: Secondary | ICD-10-CM

## 2018-10-10 DIAGNOSIS — F419 Anxiety disorder, unspecified: Secondary | ICD-10-CM | POA: Diagnosis not present

## 2018-10-10 DIAGNOSIS — F32A Depression, unspecified: Secondary | ICD-10-CM

## 2018-10-10 MED ORDER — ALPRAZOLAM 0.5 MG PO TABS: 0.5000 mg | ORAL_TABLET | Freq: Two times a day (BID) | ORAL | 3 refills | Status: DC | PRN

## 2018-10-10 NOTE — Patient Instructions (Addendum)
Schedule your complete physical at your convenience Continue the Wellbutrin daily Use the Alprazolam as needed for high anxiety moments Call with any questions or concerns Happy Holidays!!!

## 2018-10-10 NOTE — Progress Notes (Signed)
   Subjective:    Patient ID: Sierra Harris, female    DOB: 1970-11-22, 47 y.o.   MRN: [id number]  HPI Anxiety/depression- chronic problem, on Wellbutrin 150mg  daily and Alprazolam prn.  Pt feels Wellbutrin is effective at controlling depression.  When she is depressed she wants to sleep more- which has improved.  Anxiety is still an issue at times- asking for refill on Alprazolam.     Review of Systems For ROS see HPI     Objective:   Physical Exam  Constitutional: She is oriented to person, place, and time. She appears well-developed and well-nourished. No distress.  HENT:  Head: Normocephalic and atraumatic.  Eyes: Pupils are equal, round, and reactive to light. EOM are normal.  Neurological: She is alert and oriented to person, place, and time.  Psychiatric: She has a normal mood and affect. Her behavior is normal. Thought content normal.  Vitals reviewed.         Assessment & Plan:

## 2018-10-21 NOTE — Assessment & Plan Note (Signed)
Depression controlled on Wellbutrin.  Still having breakthrough anxiety at time which is controlled w/ Alprazolam.  Refill provided.  Will continue to follow.

## 2018-10-29 ENCOUNTER — Other Ambulatory Visit: Payer: Self-pay | Admitting: Family Medicine

## 2019-02-20 ENCOUNTER — Other Ambulatory Visit: Payer: Self-pay | Admitting: Family Medicine

## 2019-03-18 ENCOUNTER — Other Ambulatory Visit: Payer: Self-pay | Admitting: Family Medicine

## 2019-06-02 ENCOUNTER — Encounter: Payer: Self-pay | Admitting: Family Medicine

## 2019-06-03 DIAGNOSIS — Z20828 Contact with and (suspected) exposure to other viral communicable diseases: Secondary | ICD-10-CM | POA: Diagnosis not present

## 2019-06-13 ENCOUNTER — Other Ambulatory Visit: Payer: Self-pay | Admitting: Family Medicine

## 2019-06-15 ENCOUNTER — Other Ambulatory Visit: Payer: Self-pay | Admitting: Family Medicine

## 2019-06-16 MED ORDER — ALPRAZOLAM 0.5 MG PO TABS: 0.5000 mg | ORAL_TABLET | Freq: Two times a day (BID) | ORAL | 3 refills | Status: DC | PRN

## 2019-06-16 NOTE — Addendum Note (Signed)
Addended by: Davis Gourd on: 06/16/2019 03:30 PM   Modules accepted: Orders

## 2019-06-16 NOTE — Telephone Encounter (Signed)
Last OV 10/10/18 Alprazolam last filled 10/10/18 #60 with 3

## 2019-07-15 ENCOUNTER — Encounter: Payer: Self-pay | Admitting: Family Medicine

## 2019-09-12 DIAGNOSIS — C44719 Basal cell carcinoma of skin of left lower limb, including hip: Secondary | ICD-10-CM | POA: Diagnosis not present

## 2019-09-12 DIAGNOSIS — L820 Inflamed seborrheic keratosis: Secondary | ICD-10-CM | POA: Diagnosis not present

## 2019-09-12 DIAGNOSIS — L570 Actinic keratosis: Secondary | ICD-10-CM | POA: Diagnosis not present

## 2019-09-19 DIAGNOSIS — C44719 Basal cell carcinoma of skin of left lower limb, including hip: Secondary | ICD-10-CM | POA: Diagnosis not present

## 2019-10-07 ENCOUNTER — Other Ambulatory Visit: Payer: Self-pay | Admitting: Family Medicine

## 2020-02-01 ENCOUNTER — Other Ambulatory Visit: Payer: Self-pay | Admitting: Family Medicine

## 2020-04-27 ENCOUNTER — Other Ambulatory Visit: Payer: Self-pay | Admitting: Family Medicine

## 2020-05-12 ENCOUNTER — Other Ambulatory Visit: Payer: Self-pay | Admitting: Family Medicine

## 2020-05-12 ENCOUNTER — Other Ambulatory Visit: Payer: Self-pay

## 2020-05-12 ENCOUNTER — Ambulatory Visit
Admission: RE | Admit: 2020-05-12 | Discharge: 2020-05-12 | Disposition: A | Discharge status: Home / Self Care | Payer: BC Managed Care – PPO | Source: Ambulatory Visit | Attending: Family Medicine | Admitting: Family Medicine

## 2020-05-12 DIAGNOSIS — Z1231 Encounter for screening mammogram for malignant neoplasm of breast: Secondary | ICD-10-CM

## 2020-05-18 ENCOUNTER — Other Ambulatory Visit: Payer: Self-pay | Admitting: Family Medicine

## 2020-05-18 DIAGNOSIS — R928 Other abnormal and inconclusive findings on diagnostic imaging of breast: Secondary | ICD-10-CM

## 2020-05-19 ENCOUNTER — Other Ambulatory Visit: Payer: Self-pay

## 2020-05-19 ENCOUNTER — Other Ambulatory Visit: Payer: Self-pay | Admitting: Family Medicine

## 2020-05-19 ENCOUNTER — Ambulatory Visit
Admission: RE | Admit: 2020-05-19 | Discharge: 2020-05-19 | Disposition: A | Discharge status: Home / Self Care | Payer: BC Managed Care – PPO | Source: Ambulatory Visit | Attending: Family Medicine | Admitting: Family Medicine

## 2020-05-19 DIAGNOSIS — R921 Mammographic calcification found on diagnostic imaging of breast: Secondary | ICD-10-CM

## 2020-05-19 DIAGNOSIS — R922 Inconclusive mammogram: Secondary | ICD-10-CM | POA: Diagnosis not present

## 2020-05-19 DIAGNOSIS — R928 Other abnormal and inconclusive findings on diagnostic imaging of breast: Secondary | ICD-10-CM

## 2020-05-21 ENCOUNTER — Other Ambulatory Visit: Payer: Self-pay

## 2020-05-21 ENCOUNTER — Ambulatory Visit
Admission: RE | Admit: 2020-05-21 | Discharge: 2020-05-21 | Disposition: A | Discharge status: Home / Self Care | Payer: BC Managed Care – PPO | Source: Ambulatory Visit | Attending: Family Medicine | Admitting: Family Medicine

## 2020-05-21 DIAGNOSIS — R921 Mammographic calcification found on diagnostic imaging of breast: Secondary | ICD-10-CM

## 2020-05-21 DIAGNOSIS — N6011 Diffuse cystic mastopathy of right breast: Secondary | ICD-10-CM | POA: Diagnosis not present

## 2020-05-25 ENCOUNTER — Other Ambulatory Visit: Payer: Self-pay | Admitting: Family Medicine

## 2020-06-20 ENCOUNTER — Other Ambulatory Visit: Payer: Self-pay | Admitting: Family Medicine

## 2020-07-16 ENCOUNTER — Other Ambulatory Visit: Payer: Self-pay | Admitting: Family Medicine

## 2020-07-16 DIAGNOSIS — L738 Other specified follicular disorders: Secondary | ICD-10-CM | POA: Diagnosis not present

## 2020-07-16 DIAGNOSIS — C44712 Basal cell carcinoma of skin of right lower limb, including hip: Secondary | ICD-10-CM | POA: Diagnosis not present

## 2020-07-16 DIAGNOSIS — L810 Postinflammatory hyperpigmentation: Secondary | ICD-10-CM | POA: Diagnosis not present

## 2020-07-16 DIAGNOSIS — L304 Erythema intertrigo: Secondary | ICD-10-CM | POA: Diagnosis not present

## 2020-07-20 ENCOUNTER — Other Ambulatory Visit: Payer: Self-pay

## 2020-07-20 ENCOUNTER — Encounter: Payer: Self-pay | Admitting: Family Medicine

## 2020-07-20 ENCOUNTER — Telehealth (INDEPENDENT_AMBULATORY_CARE_PROVIDER_SITE_OTHER): Payer: BC Managed Care – PPO | Admitting: Family Medicine

## 2020-07-20 VITALS — BP 110/71 | HR 70 | Temp 97.5°F | Ht 63.0 in | Wt 136.0 lb

## 2020-07-20 DIAGNOSIS — F419 Anxiety disorder, unspecified: Secondary | ICD-10-CM

## 2020-07-20 DIAGNOSIS — F329 Major depressive disorder, single episode, unspecified: Secondary | ICD-10-CM | POA: Diagnosis not present

## 2020-07-20 DIAGNOSIS — F32A Depression, unspecified: Secondary | ICD-10-CM

## 2020-07-20 MED ORDER — ALPRAZOLAM 0.5 MG PO TABS: 0.5000 mg | ORAL_TABLET | Freq: Two times a day (BID) | ORAL | 3 refills | Status: DC | PRN

## 2020-07-20 MED ORDER — BUPROPION HCL ER (XL) 300 MG PO TB24: 300.0000 mg | ORAL_TABLET | Freq: Every day | ORAL | 1 refills | Status: DC

## 2020-07-20 NOTE — Progress Notes (Signed)
   Virtual Visit via Video   I connected with patient on 07/20/20 at 10:30 AM EDT by a video enabled telemedicine application and verified that I am speaking with the correct person using two identifiers.  Location patient: Home Location provider: [facility], Office Persons participating in the virtual visit: Patient, Provider, CMA (Jess B)  I discussed the limitations of evaluation and management by telemedicine and the availability of in person appointments. The patient expressed understanding and agreed to proceed.  Subjective:   HPI:   Anxiety/Depression- ongoing issue, currently on Wellbutrin XL 150mg  daily.  Admits this year has been 'rough'.  Is walking regularly, feels that she has good support at home.  Pt is interested in increasing Wellbutrin.  Health Maintenance- UTD on pap, mammo, COVID, flu   ROS:   See pertinent positives and negatives per HPI.  Patient Active Problem List   Diagnosis Date Noted  . Atypical ductal hyperplasia of right breast 07/09/2017  . Fatigue 07/09/2015  . Screening for malignant neoplasm of cervix 03/13/2014  . Encounter for routine gynecological examination 03/13/2014  . Anxiety and depression 03/13/2014    Social History   Tobacco Use  . Smoking status: Never Smoker  . Smokeless tobacco: Never Used  Substance Use Topics  . Alcohol use: Yes    Comment: rarely    Current Outpatient Medications:  .  ALPRAZolam (XANAX) 0.5 MG tablet, Take 1 tablet (0.5 mg total) by mouth 2 (two) times daily as needed for anxiety., Disp: 30 tablet, Rfl: 3 .  Ascorbic Acid (VITAMIN C WITH ROSE HIPS) 500 MG tablet, Take 500 mg by mouth daily., Disp: , Rfl:  .  buPROPion (WELLBUTRIN XL) 150 MG 24 hr tablet, TAKE 1 TABLET BY MOUTH EVERY DAY, Disp: 30 tablet, Rfl: 0 .  Calcium Carb-Cholecalciferol (CALCIUM + D3) 600-200 MG-UNIT TABS, Take 1 tablet by mouth daily., Disp: , Rfl:  .  clindamycin (CLEOCIN T) 1 % lotion, APPLY 1 APPLICATION ON THE SKIN  DAILY DAILY AFTER SHOWER, Disp: , Rfl:  .  mometasone (NASONEX) 50 MCG/ACT nasal spray, USE 2 SPRAYS INTO THE NOSTRILS DAILY, Disp: 17 g, Rfl: 6 .  Multiple Vitamins-Iron (STRESS B COMPLEX/IRON) TABS, Take 1 tablet by mouth daily., Disp: , Rfl:   No Known Allergies  Objective:   BP 110/71   Pulse 70   Temp (!) 97.5 F (36.4 C) (Oral)   Ht 5\' 3"  (1.6 m)   Wt 136 lb (61.7 kg)   LMP 05/21/2019 (Within Days)   BMI 24.09 kg/m  AAOx3, NAD NCAT, EOMI No obvious CN deficits Coloring WNL Pt is able to speak clearly, coherently without shortness of breath or increased work of breathing.  Thought process is linear.  Mood is appropriate.   Assessment and Plan:   Anxiety/Depression- ongoing issue for pt.  She admits the past year has been difficult and would like to temporarily increase Wellbutrin to 300mg  daily.  Will increase dose and monitor closely for improvement.  Pt to let me know in 4-6 weeks if sxs are better at higher dose.  Pt expressed understanding and is in agreement w/ plan.    , MD 07/20/2020

## 2020-07-20 NOTE — Progress Notes (Signed)
I have discussed the procedure for the virtual visit with the patient who has given consent to proceed with assessment and treatment.   Sierra Harris L Jazara Swiney, CMA     

## 2020-07-27 DIAGNOSIS — C44712 Basal cell carcinoma of skin of right lower limb, including hip: Secondary | ICD-10-CM | POA: Diagnosis not present

## 2020-08-18 ENCOUNTER — Encounter: Payer: Self-pay | Admitting: Family Medicine

## 2020-08-18 MED ORDER — BUPROPION HCL ER (XL) 300 MG PO TB24: 300.0000 mg | ORAL_TABLET | Freq: Every day | ORAL | 1 refills | Status: DC

## 2020-08-26 MED ORDER — MOMETASONE FUROATE 50 MCG/ACT NA SUSP: 2.0000 | Freq: Every day | NASAL | 3 refills | Status: DC

## 2020-08-26 NOTE — Addendum Note (Signed)
Addended by: Sheliah Hatch on: 08/26/2020 12:38 PM   Modules accepted: Orders

## 2020-09-15 DIAGNOSIS — N132 Hydronephrosis with renal and ureteral calculous obstruction: Secondary | ICD-10-CM | POA: Diagnosis not present

## 2020-09-15 DIAGNOSIS — R112 Nausea with vomiting, unspecified: Secondary | ICD-10-CM | POA: Diagnosis not present

## 2020-09-15 DIAGNOSIS — R1012 Left upper quadrant pain: Secondary | ICD-10-CM | POA: Diagnosis not present

## 2020-09-15 DIAGNOSIS — R319 Hematuria, unspecified: Secondary | ICD-10-CM | POA: Diagnosis not present

## 2020-09-15 DIAGNOSIS — R1032 Left lower quadrant pain: Secondary | ICD-10-CM | POA: Diagnosis not present

## 2020-09-15 DIAGNOSIS — N1330 Unspecified hydronephrosis: Secondary | ICD-10-CM | POA: Diagnosis not present

## 2020-09-15 DIAGNOSIS — N201 Calculus of ureter: Secondary | ICD-10-CM | POA: Diagnosis not present

## 2020-09-15 DIAGNOSIS — N200 Calculus of kidney: Secondary | ICD-10-CM | POA: Diagnosis not present

## 2020-09-15 DIAGNOSIS — K429 Umbilical hernia without obstruction or gangrene: Secondary | ICD-10-CM | POA: Diagnosis not present

## 2020-09-16 ENCOUNTER — Encounter: Payer: Self-pay | Admitting: Family Medicine

## 2020-09-16 ENCOUNTER — Telehealth (INDEPENDENT_AMBULATORY_CARE_PROVIDER_SITE_OTHER): Payer: BC Managed Care – PPO | Admitting: Family Medicine

## 2020-09-16 DIAGNOSIS — N200 Calculus of kidney: Secondary | ICD-10-CM | POA: Diagnosis not present

## 2020-09-16 NOTE — Progress Notes (Signed)
Virtual Visit via Video   I connected with patient on 09/16/20 at  8:00 AM EDT by a video enabled telemedicine application and verified that I am speaking with the correct person using two identifiers.  Location patient: Home Location provider: [place], Office Persons participating in the virtual visit: Patient, Provider, CMA (Sabrina M)  I discussed the limitations of evaluation and management by telemedicine and the availability of in person appointments. The patient expressed understanding and agreed to proceed.  Subjective:   HPI:   ER f/u- pt went to [facility]) after multiple episodes of vomiting and L sided abdominal pain.  + hematuria.  CT showed distal L ureteral stone, 2 mm in diameter at L ureterovesical junction w/ mild to moderate hydronephrosis.  She was d/c'd and started on Flomax, Naproxen, Bentyl, and Zofran.  Pt reports feeling much better but has not passed stone to her knowledge.  Pt was referred to urology in [place] which is too far- would like local referral.  Has not seen any blood in urine yet today.  Able to eat and drink w/o difficulty- eating bland diet and drinking lots of fluids.  ROS:   See pertinent positives and negatives per HPI.  Patient Active Problem List   Diagnosis Date Noted  . Atypical ductal hyperplasia of right breast 07/09/2017  . Fatigue 07/09/2015  . Screening for malignant neoplasm of cervix 03/13/2014  . Encounter for routine gynecological examination 03/13/2014  . Anxiety and depression 03/13/2014    Social History   Tobacco Use  . Smoking status: Never Smoker  . Smokeless tobacco: Never Used  Substance Use Topics  . Alcohol use: Yes    Comment: rarely    Current Outpatient Medications:  .  ALPRAZolam (XANAX) 0.5 MG tablet, Take 1 tablet (0.5 mg total) by mouth 2 (two) times daily as needed for anxiety., Disp: 60 tablet, Rfl: 3 .  Ascorbic Acid (VITAMIN C WITH ROSE HIPS) 500 MG  tablet, Take 500 mg by mouth daily., Disp: , Rfl:  .  buPROPion (WELLBUTRIN XL) 300 MG 24 hr tablet, Take 1 tablet (300 mg total) by mouth daily., Disp: 90 tablet, Rfl: 1 .  Calcium Carb-Cholecalciferol (CALCIUM + D3) 600-200 MG-UNIT TABS, Take 1 tablet by mouth daily., Disp: , Rfl:  .  clindamycin (CLEOCIN T) 1 % lotion, APPLY 1 APPLICATION ON THE SKIN DAILY DAILY AFTER SHOWER, Disp: , Rfl:  .  dicyclomine (BENTYL) 20 MG tablet, Take by mouth., Disp: , Rfl:  .  mometasone (NASONEX) 50 MCG/ACT nasal spray, Place 2 sprays into the nose daily., Disp: 3 each, Rfl: 3 .  Multiple Vitamins-Iron (STRESS B COMPLEX/IRON) TABS, Take 1 tablet by mouth daily., Disp: , Rfl:  .  tamsulosin (FLOMAX) 0.4 MG CAPS capsule, Take 0.4 mg by mouth daily., Disp: , Rfl:  .  tamsulosin (FLOMAX) 0.4 MG CAPS capsule, Take by mouth., Disp: , Rfl:   No Known Allergies  Objective:   There were no vitals taken for this visit. AAOx3, NAD NCAT, EOMI No obvious CN deficits Coloring WNL Pt is able to speak clearly, coherently without shortness of breath or increased work of breathing.  Thought process is linear.  Mood is appropriate.   Assessment and Plan:   L sided kidney stone- new.  Reviewed ER note, CT scan, labs, and medications.  Pt is no longer vomiting and is able to tolerate fluids and bland foods.  Pain is much improved as stone has likely moved  to the bladder.  Discussed that she will likely again have pain when stone is in urethra.  If she has not yet seen urology, she can message me for pain meds if needed.  Encouraged her to continue Flomax.  Reviewed supportive care and red flags that should prompt return.  Pt expressed understanding and is in agreement w/ plan.   Neena Rhymes, MD 09/16/2020

## 2020-09-22 ENCOUNTER — Encounter: Payer: Self-pay | Admitting: Family Medicine

## 2020-09-23 ENCOUNTER — Ambulatory Visit: Payer: BC Managed Care – PPO | Admitting: Family Medicine

## 2020-09-23 ENCOUNTER — Other Ambulatory Visit: Payer: Self-pay

## 2020-09-23 ENCOUNTER — Encounter: Payer: Self-pay | Admitting: Family Medicine

## 2020-09-23 VITALS — BP 130/70 | HR 73 | Temp 98.0°F | Resp 20 | Ht 63.0 in | Wt 135.0 lb

## 2020-09-23 DIAGNOSIS — R350 Frequency of micturition: Secondary | ICD-10-CM

## 2020-09-23 LAB — POCT URINALYSIS DIPSTICK OB
Bilirubin, UA: NEGATIVE
Blood, UA: NEGATIVE
Glucose, UA: NEGATIVE
Ketones, UA: NEGATIVE
Leukocytes, UA: NEGATIVE
Nitrite, UA: NEGATIVE
POC,PROTEIN,UA: NEGATIVE
Spec Grav, UA: 1.01 (ref 1.010–1.025)
Urobilinogen, UA: 0.2 E.U./dL
pH, UA: 6.5 (ref 5.0–8.0)

## 2020-09-23 MED ORDER — CEPHALEXIN 500 MG PO CAPS: 500.0000 mg | ORAL_CAPSULE | Freq: Two times a day (BID) | ORAL | 0 refills | Status: AC

## 2020-09-23 NOTE — Patient Instructions (Signed)
Follow up as needed or as scheduled START the Cephalexin twice daily- take w/ food Drink plenty of fluids Call with any questions or concerns Hang in there!!!

## 2020-09-23 NOTE — Addendum Note (Signed)
Addended by: Joylene Igo L on: 09/23/2020 02:16 PM   Modules accepted: Orders

## 2020-09-23 NOTE — Addendum Note (Signed)
Addended by: Lana Fish on: 09/23/2020 10:47 AM   Modules accepted: Orders

## 2020-09-23 NOTE — Progress Notes (Signed)
   Subjective:    Patient ID: Sierra Harris, female    DOB: 1971-06-02, 49 y.o.   MRN: [id number]  HPI Kidney stone- pt was dx'd w/ kidney stone on 10/27.  'I feel like I have to go to the bathroom constantly'.  Does have some burning after urination.  + sensation of incomplete emptying.  Has suprapubic pressure.  Pt feels she passed her kidney stone- brought this in today.  Also brought her labs from ER.   Review of Systems For ROS see HPI   This visit occurred during the SARS-CoV-2 public health emergency.  Safety protocols were in place, including screening questions prior to the visit, additional usage of staff PPE, and extensive cleaning of exam room while observing appropriate contact time as indicated for disinfecting solutions.       Objective:   Physical Exam Vitals reviewed.  Constitutional:      General: She is not in acute distress.    Appearance: Normal appearance. She is well-developed. She is not ill-appearing.  HENT:     Head: Normocephalic and atraumatic.  Abdominal:     General: There is no distension.     Palpations: Abdomen is soft.     Tenderness: There is abdominal tenderness (+ suprapubic TTP but no CVA tenderness ). There is no right CVA tenderness or left CVA tenderness.  Skin:    General: Skin is warm and dry.  Neurological:     General: No focal deficit present.     Mental Status: She is oriented to person, place, and time.  Psychiatric:        Mood and Affect: Mood normal.        Behavior: Behavior normal.        Thought Content: Thought content normal.           Assessment & Plan:  Urinary frequency- pt's sxs are consistent w/ UTI and reports this feels similar to previous infections.  UA is normal but given recent stone and her sxs, it is highly suspicious for UTI.  Start Keflex.  Await culture results.  Pt expressed understanding and is in agreement w/ plan.

## 2020-09-24 DIAGNOSIS — L814 Other melanin hyperpigmentation: Secondary | ICD-10-CM | POA: Diagnosis not present

## 2020-09-24 DIAGNOSIS — D2262 Melanocytic nevi of left upper limb, including shoulder: Secondary | ICD-10-CM | POA: Diagnosis not present

## 2020-09-24 DIAGNOSIS — D485 Neoplasm of uncertain behavior of skin: Secondary | ICD-10-CM | POA: Diagnosis not present

## 2020-09-24 DIAGNOSIS — C44519 Basal cell carcinoma of skin of other part of trunk: Secondary | ICD-10-CM | POA: Diagnosis not present

## 2020-09-24 DIAGNOSIS — L821 Other seborrheic keratosis: Secondary | ICD-10-CM | POA: Diagnosis not present

## 2020-09-24 DIAGNOSIS — C44319 Basal cell carcinoma of skin of other parts of face: Secondary | ICD-10-CM | POA: Diagnosis not present

## 2020-09-24 DIAGNOSIS — C4441 Basal cell carcinoma of skin of scalp and neck: Secondary | ICD-10-CM | POA: Diagnosis not present

## 2020-09-24 DIAGNOSIS — D2261 Melanocytic nevi of right upper limb, including shoulder: Secondary | ICD-10-CM | POA: Diagnosis not present

## 2020-09-24 DIAGNOSIS — C4401 Basal cell carcinoma of skin of lip: Secondary | ICD-10-CM | POA: Diagnosis not present

## 2020-09-24 LAB — URINE CULTURE
MICRO NUMBER:: 11161079
Result:: NO GROWTH
SPECIMEN QUALITY:: ADEQUATE

## 2020-10-27 DIAGNOSIS — Z87442 Personal history of urinary calculi: Secondary | ICD-10-CM | POA: Diagnosis not present

## 2020-11-08 DIAGNOSIS — C4401 Basal cell carcinoma of skin of lip: Secondary | ICD-10-CM | POA: Diagnosis not present

## 2020-11-08 DIAGNOSIS — C44319 Basal cell carcinoma of skin of other parts of face: Secondary | ICD-10-CM | POA: Diagnosis not present

## 2020-11-15 DIAGNOSIS — Z4802 Encounter for removal of sutures: Secondary | ICD-10-CM | POA: Diagnosis not present

## 2021-02-10 ENCOUNTER — Other Ambulatory Visit: Payer: Self-pay | Admitting: Family Medicine

## 2021-03-28 ENCOUNTER — Ambulatory Visit (INDEPENDENT_AMBULATORY_CARE_PROVIDER_SITE_OTHER): Payer: BC Managed Care – PPO | Admitting: Family Medicine

## 2021-03-28 ENCOUNTER — Other Ambulatory Visit: Payer: Self-pay

## 2021-03-28 ENCOUNTER — Other Ambulatory Visit (HOSPITAL_COMMUNITY)
Admission: RE | Admit: 2021-03-28 | Discharge: 2021-03-28 | Disposition: A | Discharge status: Home / Self Care | Payer: BC Managed Care – PPO | Source: Ambulatory Visit | Attending: Family Medicine | Admitting: Family Medicine

## 2021-03-28 ENCOUNTER — Encounter: Payer: Self-pay | Admitting: Family Medicine

## 2021-03-28 VITALS — BP 130/80 | HR 68 | Temp 98.8°F | Resp 18 | Ht 64.0 in | Wt 133.2 lb

## 2021-03-28 DIAGNOSIS — Z1211 Encounter for screening for malignant neoplasm of colon: Secondary | ICD-10-CM

## 2021-03-28 DIAGNOSIS — Z01419 Encounter for gynecological examination (general) (routine) without abnormal findings: Secondary | ICD-10-CM

## 2021-03-28 DIAGNOSIS — Z0000 Encounter for general adult medical examination without abnormal findings: Secondary | ICD-10-CM | POA: Diagnosis not present

## 2021-03-28 DIAGNOSIS — Z124 Encounter for screening for malignant neoplasm of cervix: Secondary | ICD-10-CM | POA: Insufficient documentation

## 2021-03-28 LAB — CBC WITH DIFFERENTIAL/PLATELET
Basophils Absolute: 0 10*3/uL (ref 0.0–0.1)
Basophils Relative: 0.9 % (ref 0.0–3.0)
Eosinophils Absolute: 0.1 10*3/uL (ref 0.0–0.7)
Eosinophils Relative: 1.2 % (ref 0.0–5.0)
HCT: 40.2 % (ref 36.0–46.0)
Hemoglobin: 13.9 g/dL (ref 12.0–15.0)
Lymphocytes Relative: 33.2 % (ref 12.0–46.0)
Lymphs Abs: 1.6 10*3/uL (ref 0.7–4.0)
MCHC: 34.4 g/dL (ref 30.0–36.0)
MCV: 95 fl (ref 78.0–100.0)
Monocytes Absolute: 0.4 10*3/uL (ref 0.1–1.0)
Monocytes Relative: 8.4 % (ref 3.0–12.0)
Neutro Abs: 2.7 10*3/uL (ref 1.4–7.7)
Neutrophils Relative %: 56.3 % (ref 43.0–77.0)
Platelets: 268 10*3/uL (ref 150.0–400.0)
RBC: 4.23 Mil/uL (ref 3.87–5.11)
RDW: 12.8 % (ref 11.5–15.5)
WBC: 4.8 10*3/uL (ref 4.0–10.5)

## 2021-03-28 LAB — HEPATIC FUNCTION PANEL
ALT: 29 U/L (ref 0–35)
AST: 23 U/L (ref 0–37)
Albumin: 4.6 g/dL (ref 3.5–5.2)
Alkaline Phosphatase: 90 U/L (ref 39–117)
Bilirubin, Direct: 0.2 mg/dL (ref 0.0–0.3)
Total Bilirubin: 1.1 mg/dL (ref 0.2–1.2)
Total Protein: 7 g/dL (ref 6.0–8.3)

## 2021-03-28 LAB — BASIC METABOLIC PANEL
BUN: 13 mg/dL (ref 6–23)
CO2: 29 mEq/L (ref 19–32)
Calcium: 9.3 mg/dL (ref 8.4–10.5)
Chloride: 104 mEq/L (ref 96–112)
Creatinine, Ser: 0.97 mg/dL (ref 0.40–1.20)
GFR: 68.98 mL/min (ref >60.00)
Glucose, Bld: 82 mg/dL (ref 70–99)
Potassium: 4 mEq/L (ref 3.5–5.1)
Sodium: 141 mEq/L (ref 135–145)

## 2021-03-28 LAB — LIPID PANEL
Cholesterol: 222 mg/dL — ABNORMAL HIGH (ref 0–200)
HDL: 67.3 mg/dL (ref >39.00)
LDL Cholesterol: 134 mg/dL — ABNORMAL HIGH (ref 0–99)
NonHDL: 154.84
Total CHOL/HDL Ratio: 3
Triglycerides: 104 mg/dL (ref 0.0–149.0)
VLDL: 20.8 mg/dL (ref 0.0–40.0)

## 2021-03-28 LAB — TSH: TSH: 1.03 u[IU]/mL (ref 0.35–4.50)

## 2021-03-28 NOTE — Progress Notes (Signed)
   Subjective:    Patient ID: Sierra Harris, female    DOB: [birth year], 50 y.o.   MRN: [id number]  HPI CPE- UTD on Tdap, flu, COVID.  UTD on mammo.  Due for pap, colonoscopy.  No family hx of colon cancer.  Pt prefers cologuard  Reviewed past medical, surgical, family and social histories.   Health Maintenance  Topic Date Due  . COLONOSCOPY (Pts 45-22yrs Insurance coverage will need to be confirmed)  Never done  . PAP SMEAR-Modifier  10/04/2020  . Hepatitis C Screening  07/20/2021 (Originally 06/29/1990)  . HIV Screening  07/20/2021 (Originally 06/30/1987)  . INFLUENZA VACCINE  06/20/2021  . TETANUS/TDAP  03/16/2024  . COVID-19 Vaccine  Completed  . HPV VACCINES  Aged Out    Review of Systems Patient reports no vision/ hearing changes, adenopathy,fever, weight change,  persistant/recurrent hoarseness , swallowing issues, chest pain, palpitations, edema, persistant/recurrent cough, hemoptysis, dyspnea (rest/exertional/paroxysmal nocturnal), gastrointestinal bleeding (melena, rectal bleeding), abdominal pain, significant heartburn, bowel changes, GU symptoms (dysuria, hematuria, incontinence), Gyn symptoms (abnormal  bleeding, pain),  syncope, focal weakness, memory loss, numbness & tingling, skin/hair/nail changes, abnormal bruising or bleeding, anxiety, or depression.   This visit occurred during the SARS-CoV-2 public health emergency.  Safety protocols were in place, including screening questions prior to the visit, additional usage of staff PPE, and extensive cleaning of exam room while observing appropriate contact time as indicated for disinfecting solutions.       Objective:   Physical Exam  General Appearance:    Alert, cooperative, no distress, appears stated age  Head:    Normocephalic, without obvious abnormality, atraumatic  Eyes:    PERRL, conjunctiva/corneas clear, EOM's intact, fundi    benign, both eyes  Ears:    Normal TM's and external ear canals, both ears  Nose:    Deferred due to COVID  Throat:   Neck:   Supple, symmetrical, trachea midline, no adenopathy;    Thyroid: no enlargement/tenderness/nodules  Back:     Symmetric, no curvature, ROM normal, no CVA tenderness  Lungs:     Clear to auscultation bilaterally, respirations unlabored  Chest Wall:    No tenderness or deformity   Heart:    Regular rate and rhythm, S1 and S2 normal, no murmur, rub   or gallop  Breast Exam:    Deferred to mammo  Abdomen:     Soft, non-tender, bowel sounds active all four quadrants,    no masses, no organomegaly  Genitalia:    External genitalia normal, cervix normal in appearance, no CMT, uterus in normal size and position, adnexa w/out mass or tenderness, mucosa pink and moist, no lesions or discharge present  Rectal:    Normal external appearance  Extremities:   Extremities normal, atraumatic, no cyanosis or edema  Pulses:   2+ and symmetric all extremities  Skin:   Skin color, texture, turgor normal, no rashes or lesions  Lymph nodes:   Cervical, supraclavicular, and axillary nodes normal  Neurologic:   CNII-XII intact, normal strength, sensation and reflexes    throughout         Assessment & Plan:

## 2021-03-28 NOTE — Patient Instructions (Signed)
Follow up in 1 year or as needed We'll notify you of your lab results and make any changes if needed Complete and return the cologuard as directed Keep up the good work on healthy diet and regular exercise- you look great! Call with any questions or concerns Good Luck on your interviews!!!

## 2021-03-28 NOTE — Assessment & Plan Note (Signed)
Pap collected. 

## 2021-03-28 NOTE — Assessment & Plan Note (Signed)
Pt's PE WNL.  UTD on mammo, immunizations.  Pap done today.  Cologuard ordered (pt's preference).  Check labs.  Anticipatory guidance provided.

## 2021-03-29 ENCOUNTER — Encounter: Payer: Self-pay | Admitting: Family Medicine

## 2021-04-01 LAB — CYTOLOGY - PAP
Comment: NEGATIVE
Diagnosis: NEGATIVE
High risk HPV: NEGATIVE

## 2021-05-06 ENCOUNTER — Other Ambulatory Visit: Payer: Self-pay | Admitting: Family Medicine

## 2021-05-08 ENCOUNTER — Other Ambulatory Visit: Payer: Self-pay | Admitting: Family Medicine

## 2021-05-09 NOTE — Telephone Encounter (Signed)
LFD 07/20/20 #60 with 3 refills LOV 03/28/21 NOV None

## 2021-05-18 ENCOUNTER — Encounter: Payer: Self-pay | Admitting: *Deleted

## 2021-05-18 ENCOUNTER — Other Ambulatory Visit: Payer: Self-pay | Admitting: Family Medicine

## 2021-05-18 DIAGNOSIS — Z1231 Encounter for screening mammogram for malignant neoplasm of breast: Secondary | ICD-10-CM

## 2021-05-20 ENCOUNTER — Other Ambulatory Visit: Payer: Self-pay

## 2021-05-20 ENCOUNTER — Ambulatory Visit
Admission: RE | Admit: 2021-05-20 | Discharge: 2021-05-20 | Disposition: A | Discharge status: Home / Self Care | Payer: BC Managed Care – PPO | Source: Ambulatory Visit | Attending: Family Medicine | Admitting: Family Medicine

## 2021-05-20 DIAGNOSIS — Z1231 Encounter for screening mammogram for malignant neoplasm of breast: Secondary | ICD-10-CM | POA: Diagnosis not present

## 2021-07-01 IMAGING — MG MM DIGITAL SCREENING BILAT W/ TOMO AND CAD
8 series · 9 of 24 positions shown · non-contrast
Comparison: Previous exam(s).

CLINICAL DATA: Screening.

EXAM:
DIGITAL SCREENING BILATERAL MAMMOGRAM WITH TOMOSYNTHESIS AND CAD
TECHNIQUE: Bilateral screening digital craniocaudal and mediolateral oblique
mammograms were obtained. Bilateral screening digital breast
tomosynthesis was performed. The images were evaluated with
computer-aided detection.

[L CC synth-2D]
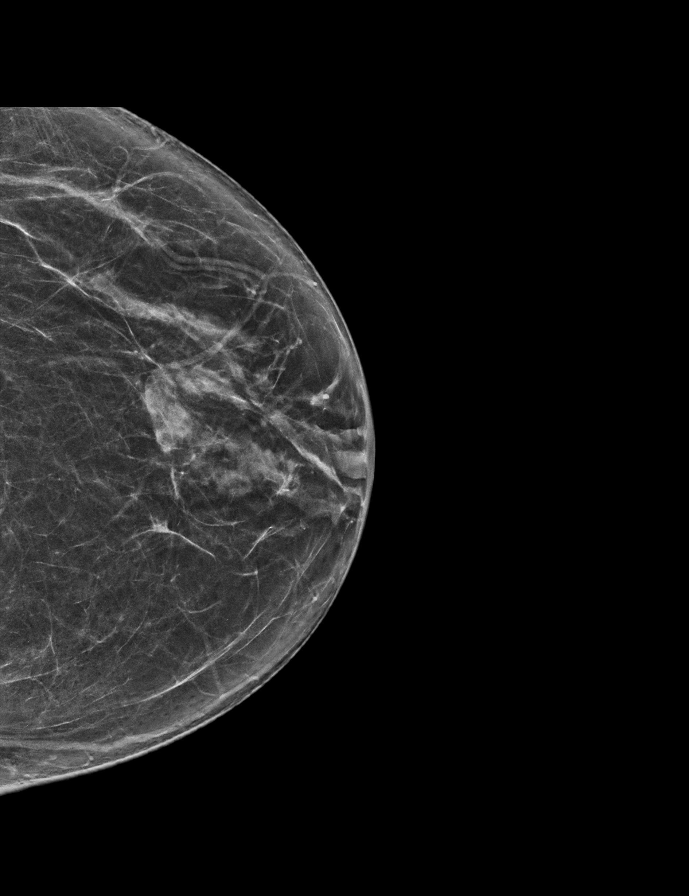

[R CC synth-2D]
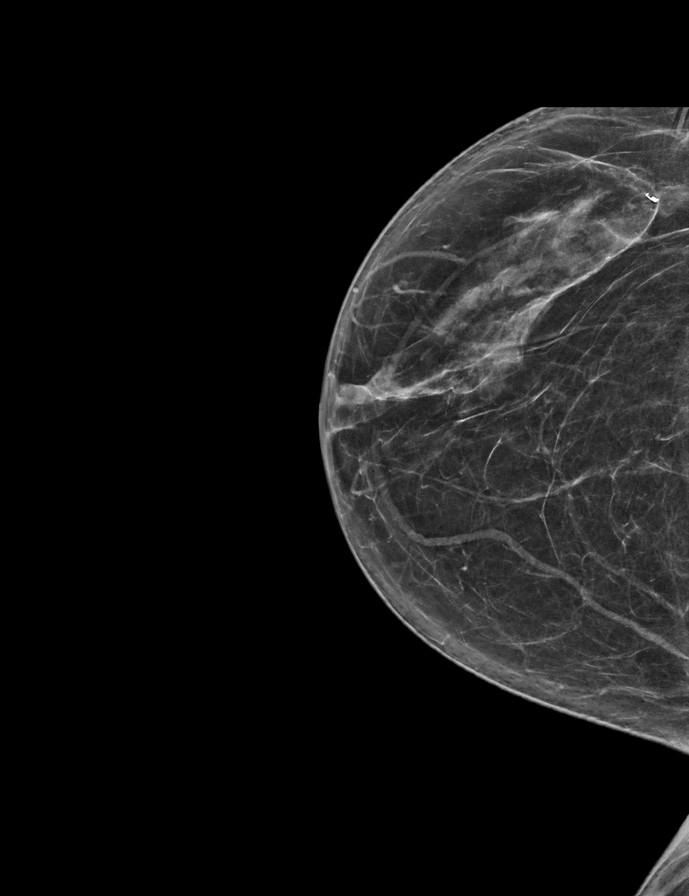

[L MLO synth-2D]
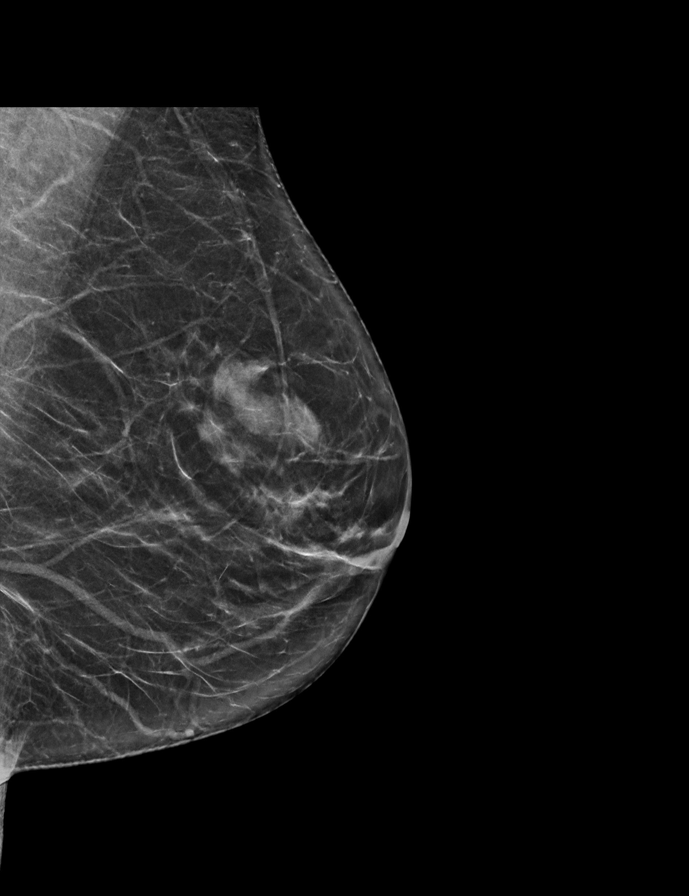

[R MLO synth-2D]
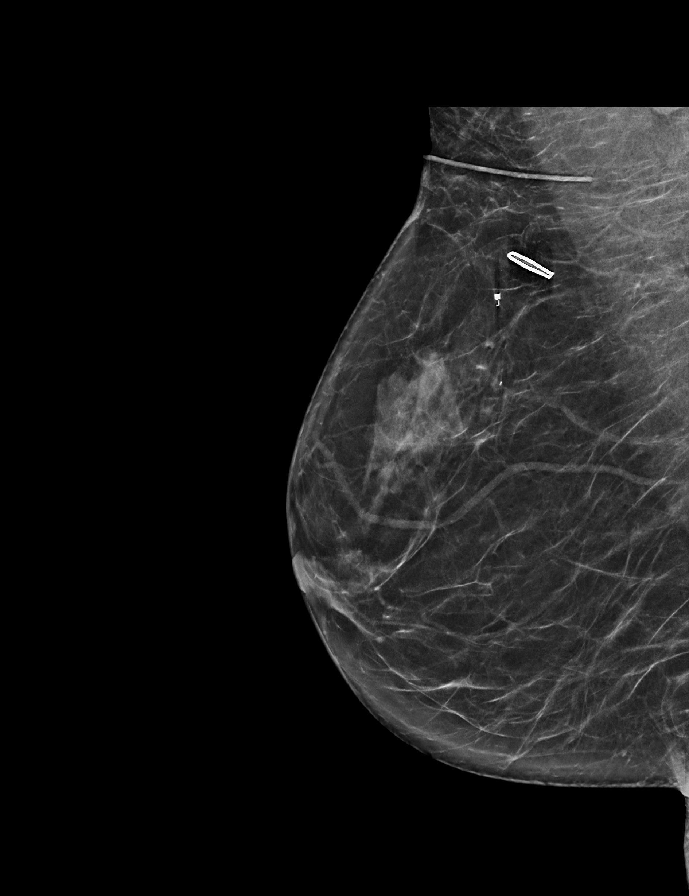

[R MLO tomo · 2 of 61 frames shown]
[frame 20/61]
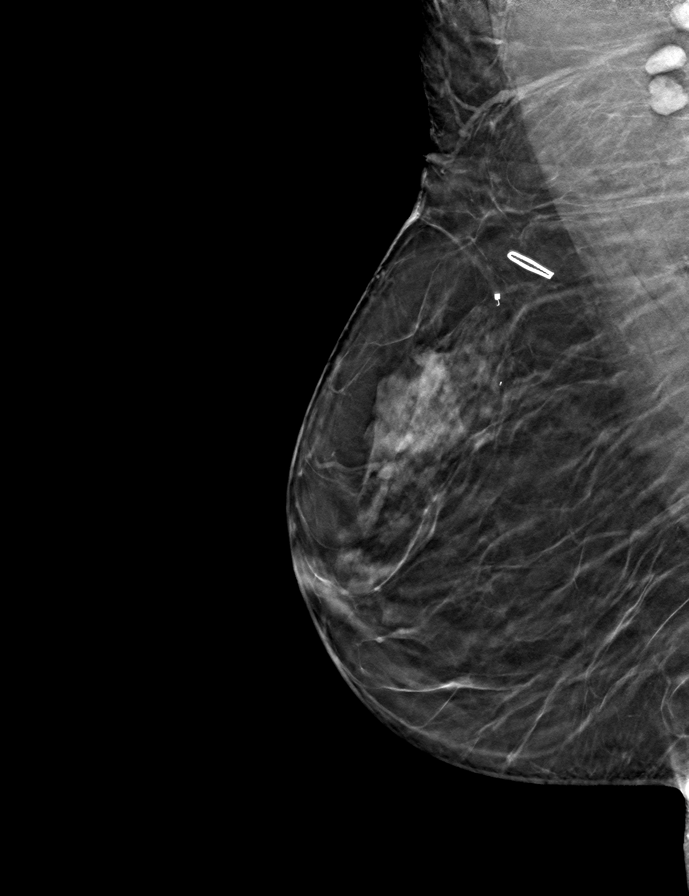
[frame 31/61]
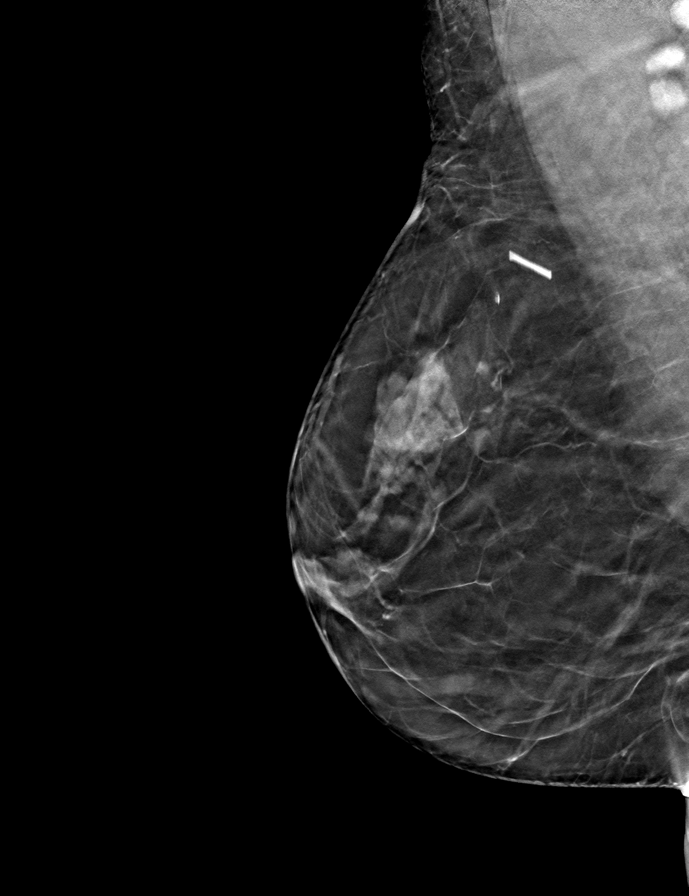

[L CC tomo · tomo slice 31/62.0]
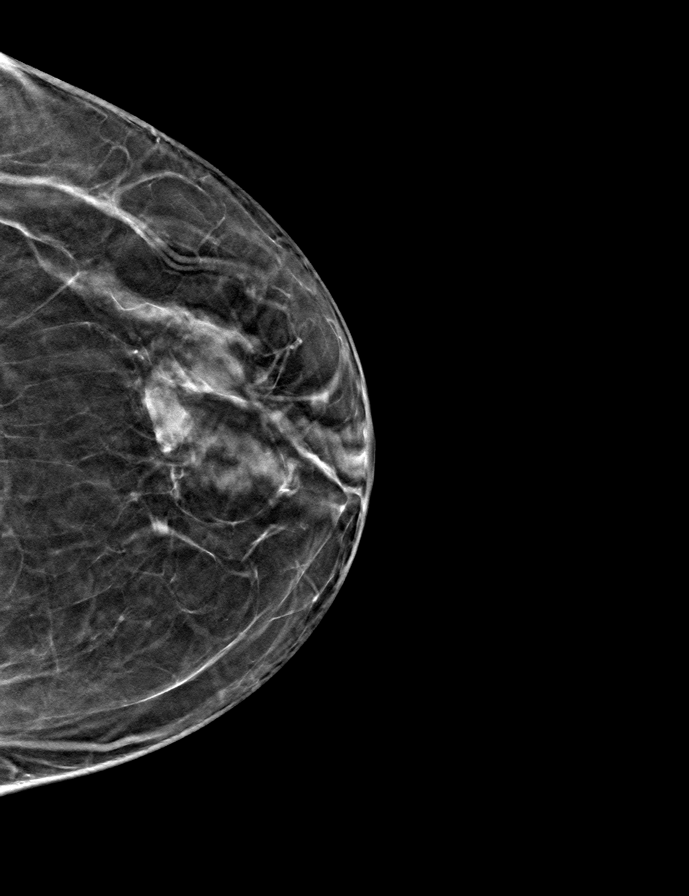

[R CC tomo · tomo slice 31/60.0]
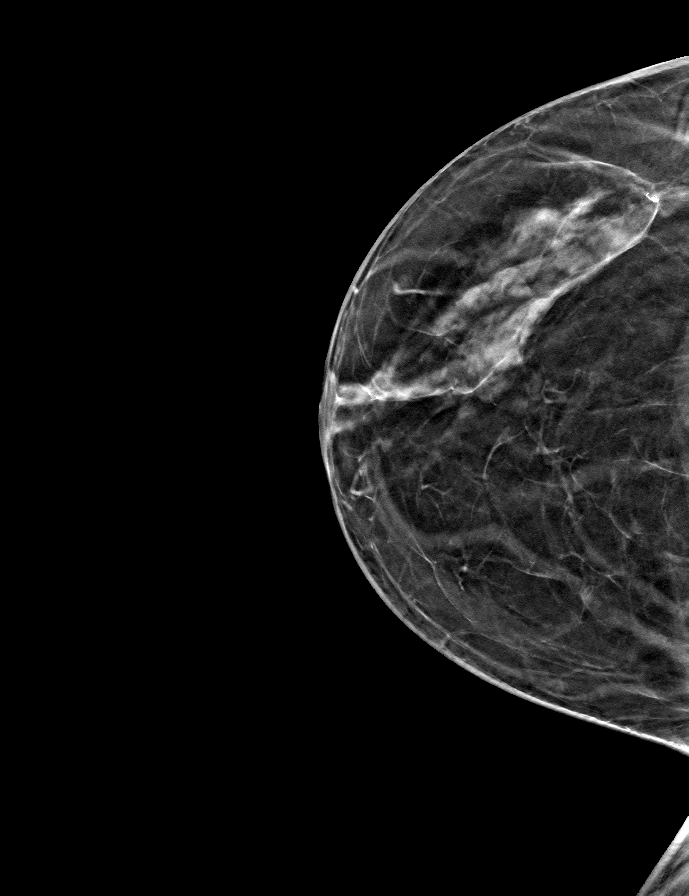

[L MLO tomo · tomo slice 33/64.0]
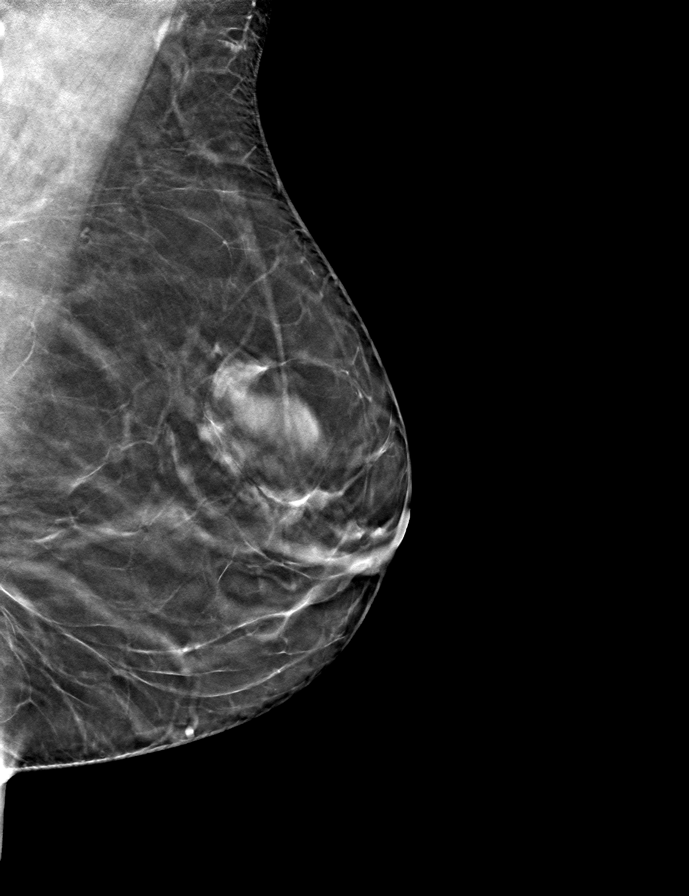

[9 of 24 positions shown; findings below may reference images not displayed]

ACR Breast Density Category b: There are scattered areas of
fibroglandular density.
FINDINGS: There are no findings suspicious for malignancy.
IMPRESSION: No mammographic evidence of malignancy. A result letter of this
screening mammogram will be mailed directly to the patient.

RECOMMENDATION:
Screening mammogram in one year. (Code:51-O-LD2)

BI-RADS CATEGORY  1: Negative.

## 2021-07-30 ENCOUNTER — Encounter: Payer: Self-pay | Admitting: Family Medicine

## 2021-08-01 ENCOUNTER — Telehealth: Payer: Self-pay

## 2021-08-01 MED ORDER — BUPROPION HCL ER (XL) 300 MG PO TB24: 300.0000 mg | ORAL_TABLET | Freq: Every day | ORAL | 0 refills | Status: DC

## 2021-08-01 NOTE — Telephone Encounter (Signed)
refill 

## 2021-08-01 NOTE — Telephone Encounter (Signed)
Patient was last seen on 03/28/2021 and requested to follow up in one year. She would like a 90 day refill on the Bupropion. Please advise

## 2021-08-03 ENCOUNTER — Other Ambulatory Visit: Payer: Self-pay | Admitting: Family Medicine

## 2021-09-06 ENCOUNTER — Encounter: Payer: Self-pay | Admitting: Family Medicine

## 2021-09-10 ENCOUNTER — Other Ambulatory Visit: Payer: Self-pay | Admitting: Family Medicine

## 2021-11-01 ENCOUNTER — Other Ambulatory Visit: Payer: Self-pay | Admitting: Family Medicine

## 2022-01-28 ENCOUNTER — Other Ambulatory Visit: Payer: Self-pay | Admitting: Family Medicine

## 2022-01-30 NOTE — Telephone Encounter (Signed)
Patient is requesting a refill of the following medications: ?Requested Prescriptions  ? ?Pending Prescriptions Disp Refills  ? ALPRAZolam (XANAX) 0.5 MG tablet [Pharmacy Med Name: ALPRAZolam 0.5 MG TABS 0.5 Tablet] 60 tablet 2  ?  Sig: TAKE ONE TABLET BY MOUTH TWO TIMES A DAY AS NEEDED FOR ANXIETY  ? buPROPion (WELLBUTRIN XL) 300 MG 24 hr tablet [Pharmacy Med Name: BuPROPion HCL XL 300 MG 300 Tablet] 90 tablet 2  ?  Sig: TAKE ONE TABLET BY MOUTH ONCE DAILY  ? ? ?Date of patient request: 01/28/2022 ?Last office visit: 03/28/2021 ?Date of last refill:  ?Last refill amount: 60& 90 tablets  ?Follow up time period per chart: none ? ?

## 2022-03-31 ENCOUNTER — Other Ambulatory Visit: Payer: Self-pay | Admitting: Family Medicine

## 2022-04-25 ENCOUNTER — Other Ambulatory Visit: Payer: Self-pay | Admitting: Family Medicine

## 2022-05-16 ENCOUNTER — Other Ambulatory Visit: Payer: Self-pay | Admitting: Family Medicine

## 2022-05-16 DIAGNOSIS — Z1231 Encounter for screening mammogram for malignant neoplasm of breast: Secondary | ICD-10-CM

## 2022-05-25 ENCOUNTER — Ambulatory Visit
Admission: RE | Admit: 2022-05-25 | Discharge: 2022-05-25 | Disposition: A | Discharge status: Home / Self Care | Payer: PRIVATE HEALTH INSURANCE | Source: Ambulatory Visit | Attending: Family Medicine | Admitting: Family Medicine

## 2022-05-25 DIAGNOSIS — Z1231 Encounter for screening mammogram for malignant neoplasm of breast: Secondary | ICD-10-CM

## 2022-07-29 ENCOUNTER — Other Ambulatory Visit: Payer: Self-pay | Admitting: Family Medicine

## 2022-12-01 ENCOUNTER — Other Ambulatory Visit: Payer: Self-pay | Admitting: Family Medicine

## 2022-12-01 NOTE — Telephone Encounter (Signed)
Left VM stating Rx has been sent in  

## 2022-12-01 NOTE — Telephone Encounter (Signed)
Xanax 0.5 mg LOV: 03/28/21 Last Refill:03/31/22 Upcoming appt: no apt

## 2022-12-06 ENCOUNTER — Encounter: Payer: Self-pay | Admitting: Family Medicine

## 2022-12-11 ENCOUNTER — Telehealth: Payer: PRIVATE HEALTH INSURANCE | Admitting: Family Medicine

## 2022-12-20 ENCOUNTER — Telehealth: Payer: PRIVATE HEALTH INSURANCE | Admitting: Family Medicine

## 2023-04-26 ENCOUNTER — Ambulatory Visit (INDEPENDENT_AMBULATORY_CARE_PROVIDER_SITE_OTHER): Payer: PRIVATE HEALTH INSURANCE | Admitting: Family Medicine

## 2023-04-26 ENCOUNTER — Encounter: Payer: Self-pay | Admitting: Family Medicine

## 2023-04-26 ENCOUNTER — Telehealth: Payer: Self-pay

## 2023-04-26 VITALS — BP 120/84 | HR 86 | Temp 98.2°F | Resp 18 | Ht 64.0 in | Wt 142.0 lb

## 2023-04-26 DIAGNOSIS — F32A Depression, unspecified: Secondary | ICD-10-CM | POA: Diagnosis not present

## 2023-04-26 DIAGNOSIS — E785 Hyperlipidemia, unspecified: Secondary | ICD-10-CM | POA: Diagnosis not present

## 2023-04-26 DIAGNOSIS — F419 Anxiety disorder, unspecified: Secondary | ICD-10-CM

## 2023-04-26 DIAGNOSIS — Z1211 Encounter for screening for malignant neoplasm of colon: Secondary | ICD-10-CM

## 2023-04-26 LAB — CBC WITH DIFFERENTIAL/PLATELET
Basophils Absolute: 0 10*3/uL (ref 0.0–0.1)
Basophils Relative: 0.8 % (ref 0.0–3.0)
Eosinophils Absolute: 0 10*3/uL (ref 0.0–0.7)
Eosinophils Relative: 0.8 % (ref 0.0–5.0)
HCT: 42.4 % (ref 36.0–46.0)
Hemoglobin: 14.1 g/dL (ref 12.0–15.0)
Lymphocytes Relative: 28.9 % (ref 12.0–46.0)
Lymphs Abs: 1.6 10*3/uL (ref 0.7–4.0)
MCHC: 33.2 g/dL (ref 30.0–36.0)
MCV: 94.2 fl (ref 78.0–100.0)
Monocytes Absolute: 0.4 10*3/uL (ref 0.1–1.0)
Monocytes Relative: 7 % (ref 3.0–12.0)
Neutro Abs: 3.5 10*3/uL (ref 1.4–7.7)
Neutrophils Relative %: 62.5 % (ref 43.0–77.0)
Platelets: 310 10*3/uL (ref 150.0–400.0)
RBC: 4.51 Mil/uL (ref 3.87–5.11)
RDW: 12.6 % (ref 11.5–15.5)
WBC: 5.6 10*3/uL (ref 4.0–10.5)

## 2023-04-26 LAB — TSH: TSH: 1.03 u[IU]/mL (ref 0.35–5.50)

## 2023-04-26 LAB — BASIC METABOLIC PANEL
BUN: 15 mg/dL (ref 6–23)
CO2: 29 mEq/L (ref 19–32)
Calcium: 10.4 mg/dL (ref 8.4–10.5)
Chloride: 104 mEq/L (ref 96–112)
Creatinine, Ser: 0.99 mg/dL (ref 0.40–1.20)
GFR: 66.34 mL/min (ref >60.00)
Glucose, Bld: 93 mg/dL (ref 70–99)
Potassium: 4.4 mEq/L (ref 3.5–5.1)
Sodium: 141 mEq/L (ref 135–145)

## 2023-04-26 LAB — LIPID PANEL
Cholesterol: 206 mg/dL — ABNORMAL HIGH (ref 0–200)
HDL: 64 mg/dL (ref >39.00)
LDL Cholesterol: 120 mg/dL — ABNORMAL HIGH (ref 0–99)
NonHDL: 141.73
Total CHOL/HDL Ratio: 3
Triglycerides: 110 mg/dL (ref 0.0–149.0)
VLDL: 22 mg/dL (ref 0.0–40.0)

## 2023-04-26 LAB — HEPATIC FUNCTION PANEL
ALT: 22 U/L (ref 0–35)
AST: 22 U/L (ref 0–37)
Albumin: 4.6 g/dL (ref 3.5–5.2)
Alkaline Phosphatase: 102 U/L (ref 39–117)
Bilirubin, Direct: 0.2 mg/dL (ref 0.0–0.3)
Total Bilirubin: 1 mg/dL (ref 0.2–1.2)
Total Protein: 7.6 g/dL (ref 6.0–8.3)

## 2023-04-26 MED ORDER — ALPRAZOLAM 0.5 MG PO TABS: ORAL_TABLET | ORAL | 3 refills | Status: DC

## 2023-04-26 MED ORDER — MOMETASONE FUROATE 50 MCG/ACT NA SUSP: NASAL | 3 refills | Status: DC

## 2023-04-26 MED ORDER — BUPROPION HCL ER (XL) 300 MG PO TB24: 300.0000 mg | ORAL_TABLET | Freq: Every day | ORAL | 0 refills | Status: DC

## 2023-04-26 NOTE — Assessment & Plan Note (Signed)
Ongoing issue.  Pt feels sxs are well controlled on Wellbutrin XL 300mg  daily.  Refill provided on meds.  No changes at this time

## 2023-04-26 NOTE — Assessment & Plan Note (Signed)
Ongoing issue for pt.  Last LDL 134.  Currently asymptomatic.  Check labs and start medication as needed

## 2023-04-26 NOTE — Patient Instructions (Signed)
Follow up in 1 year or as needed We'll notify you of your lab results and make any changes if needed Keep up the good work on healthy diet and regular exercise- you're doing great! They should call you to schedule your colonoscopy consultation Call with any questions or concerns Stay Safe!  Stay Healthy! Have a great summer!!!

## 2023-04-26 NOTE — Progress Notes (Signed)
   Subjective:    Patient ID: Sierra Harris, female    DOB: October 09, 1971, 52 y.o.   MRN: [id number]  HPI Anxiety/Depression- ongoing issue.  Currently on Wellbutrin 300mg  daily and Alprazolam 0.5mg  PRN.  Pt feels that things are 'better now than they have been'.  Pt moved and switched careers which was very stressful.  Hyperlipidemia- pt's last LDL 134.  Denies CP, SOB, abd pain, N/V.  Colon cancer screen due   Review of Systems For ROS see HPI     Objective:   Physical Exam Vitals reviewed.  Constitutional:      General: She is not in acute distress.    Appearance: Normal appearance. She is well-developed. She is not ill-appearing.  HENT:     Head: Normocephalic and atraumatic.  Eyes:     Conjunctiva/sclera: Conjunctivae normal.     Pupils: Pupils are equal, round, and reactive to light.  Neck:     Thyroid: No thyromegaly.  Cardiovascular:     Rate and Rhythm: Normal rate and regular rhythm.     Pulses: Normal pulses.     Heart sounds: Normal heart sounds. No murmur heard. Pulmonary:     Effort: Pulmonary effort is normal. No respiratory distress.     Breath sounds: Normal breath sounds.  Abdominal:     General: There is no distension.     Palpations: Abdomen is soft.     Tenderness: There is no abdominal tenderness.  Musculoskeletal:     Cervical back: Normal range of motion and neck supple.     Right lower leg: No edema.     Left lower leg: No edema.  Lymphadenopathy:     Cervical: No cervical adenopathy.  Skin:    General: Skin is warm and dry.  Neurological:     General: No focal deficit present.     Mental Status: She is alert and oriented to person, place, and time.  Psychiatric:        Mood and Affect: Mood normal.        Behavior: Behavior normal.        Thought Content: Thought content normal.           Assessment & Plan:

## 2023-04-26 NOTE — Telephone Encounter (Signed)
-----   Message from Sheliah Hatch, MD sent at 04/26/2023  2:38 PM EDT ----- Labs look great!  No changes at this time

## 2023-05-01 ENCOUNTER — Other Ambulatory Visit: Payer: Self-pay | Admitting: Family Medicine

## 2023-05-01 DIAGNOSIS — Z1231 Encounter for screening mammogram for malignant neoplasm of breast: Secondary | ICD-10-CM

## 2023-07-09 ENCOUNTER — Ambulatory Visit
Admission: RE | Admit: 2023-07-09 | Discharge: 2023-07-09 | Disposition: A | Discharge status: Home / Self Care | Payer: PRIVATE HEALTH INSURANCE | Source: Ambulatory Visit | Attending: Family Medicine | Admitting: Family Medicine

## 2023-07-09 DIAGNOSIS — Z1231 Encounter for screening mammogram for malignant neoplasm of breast: Secondary | ICD-10-CM

## 2023-08-02 ENCOUNTER — Other Ambulatory Visit: Payer: Self-pay | Admitting: Family Medicine

## 2023-08-23 LAB — HM COLONOSCOPY

## 2023-09-30 ENCOUNTER — Encounter: Payer: Self-pay | Admitting: Family Medicine

## 2023-10-01 ENCOUNTER — Encounter: Payer: Self-pay | Admitting: Family Medicine

## 2023-10-02 NOTE — Telephone Encounter (Signed)
Noted. Thanks.

## 2023-11-13 ENCOUNTER — Other Ambulatory Visit: Payer: Self-pay | Admitting: Family Medicine

## 2024-02-08 ENCOUNTER — Other Ambulatory Visit: Payer: Self-pay | Admitting: Family Medicine

## 2024-05-01 ENCOUNTER — Other Ambulatory Visit: Payer: Self-pay | Admitting: Family Medicine

## 2024-05-01 NOTE — Telephone Encounter (Signed)
 LM to call back and make an appointment pt is overdue

## 2024-05-26 ENCOUNTER — Other Ambulatory Visit: Payer: Self-pay | Admitting: Family Medicine

## 2024-05-26 MED ORDER — MOMETASONE FUROATE 50 MCG/ACT NA SUSP: NASAL | 3 refills | Status: DC

## 2024-05-26 NOTE — Telephone Encounter (Signed)
 Copied from CRM [id number]. Topic: Clinical - Medication Refill >> May 26, 2024 10:25 AM Mesmerise C wrote: Medication: mometasone  (NASONEX ) 50 MCG/ACT nasal spray  Has the patient contacted their pharmacy? No (Agent: If no, request that the patient contact the pharmacy for the refill. If patient does not wish to contact the pharmacy document the reason why and proceed with request.) (Agent: If yes, when and what did the pharmacy advise?)  This is the patient's preferred pharmacy:   [facility] - [place], [place] - [address] [place] [address] Phone: [phone] Fax: [phone]  Is this the correct pharmacy for this prescription? Yes If no, delete pharmacy and type the correct one.   Has the prescription been filled recently? No  Is the patient out of the medication? No  Has the patient been seen for an appointment in the last year OR does the patient have an upcoming appointment? Yes  Can we respond through MyChart? Yes  Agent: Please be advised that Rx refills may take up to 3 business days. We ask that you follow-up with your pharmacy.

## 2024-05-28 ENCOUNTER — Ambulatory Visit (INDEPENDENT_AMBULATORY_CARE_PROVIDER_SITE_OTHER): Payer: PRIVATE HEALTH INSURANCE | Admitting: Family Medicine

## 2024-05-28 ENCOUNTER — Ambulatory Visit: Payer: Self-pay | Admitting: Family Medicine

## 2024-05-28 ENCOUNTER — Encounter: Payer: Self-pay | Admitting: Family Medicine

## 2024-05-28 VITALS — BP 128/78 | HR 77 | Temp 97.8°F | Ht 63.25 in | Wt 142.5 lb

## 2024-05-28 DIAGNOSIS — Z0000 Encounter for general adult medical examination without abnormal findings: Secondary | ICD-10-CM | POA: Diagnosis not present

## 2024-05-28 DIAGNOSIS — E785 Hyperlipidemia, unspecified: Secondary | ICD-10-CM

## 2024-05-28 LAB — BASIC METABOLIC PANEL WITH GFR
BUN: 18 mg/dL (ref 6–23)
CO2: 30 meq/L (ref 19–32)
Calcium: 9.4 mg/dL (ref 8.4–10.5)
Chloride: 103 meq/L (ref 96–112)
Creatinine, Ser: 0.92 mg/dL (ref 0.40–1.20)
GFR: 71.89 mL/min (ref >60.00)
Glucose, Bld: 89 mg/dL (ref 70–99)
Potassium: 4.1 meq/L (ref 3.5–5.1)
Sodium: 140 meq/L (ref 135–145)

## 2024-05-28 LAB — LIPID PANEL
Cholesterol: 209 mg/dL — ABNORMAL HIGH (ref 0–200)
HDL: 62.7 mg/dL (ref >39.00)
LDL Cholesterol: 126 mg/dL — ABNORMAL HIGH (ref 0–99)
NonHDL: 146.53
Total CHOL/HDL Ratio: 3
Triglycerides: 103 mg/dL (ref 0.0–149.0)
VLDL: 20.6 mg/dL (ref 0.0–40.0)

## 2024-05-28 LAB — TSH: TSH: 0.74 u[IU]/mL (ref 0.35–5.50)

## 2024-05-28 LAB — CBC WITH DIFFERENTIAL/PLATELET
Basophils Absolute: 0 K/uL (ref 0.0–0.1)
Basophils Relative: 0.9 % (ref 0.0–3.0)
Eosinophils Absolute: 0 K/uL (ref 0.0–0.7)
Eosinophils Relative: 1.1 % (ref 0.0–5.0)
HCT: 41.6 % (ref 36.0–46.0)
Hemoglobin: 14.1 g/dL (ref 12.0–15.0)
Lymphocytes Relative: 35.5 % (ref 12.0–46.0)
Lymphs Abs: 1.6 K/uL (ref 0.7–4.0)
MCHC: 33.9 g/dL (ref 30.0–36.0)
MCV: 92.6 fl (ref 78.0–100.0)
Monocytes Absolute: 0.4 K/uL (ref 0.1–1.0)
Monocytes Relative: 8.9 % (ref 3.0–12.0)
Neutro Abs: 2.4 K/uL (ref 1.4–7.7)
Neutrophils Relative %: 53.6 % (ref 43.0–77.0)
Platelets: 306 K/uL (ref 150.0–400.0)
RBC: 4.5 Mil/uL (ref 3.87–5.11)
RDW: 12.7 % (ref 11.5–15.5)
WBC: 4.4 K/uL (ref 4.0–10.5)

## 2024-05-28 LAB — HEPATIC FUNCTION PANEL
ALT: 26 U/L (ref 0–35)
AST: 23 U/L (ref 0–37)
Albumin: 4.6 g/dL (ref 3.5–5.2)
Alkaline Phosphatase: 104 U/L (ref 39–117)
Bilirubin, Direct: 0.2 mg/dL (ref 0.0–0.3)
Total Bilirubin: 1.2 mg/dL (ref 0.2–1.2)
Total Protein: 7.5 g/dL (ref 6.0–8.3)

## 2024-05-28 MED ORDER — MOMETASONE FUROATE 50 MCG/ACT NA SUSP: NASAL | 3 refills | Status: AC

## 2024-05-28 NOTE — Patient Instructions (Signed)
Follow up in 1 year or as needed We'll notify you of your lab results and make any changes if needed Keep up the good work on healthy diet and regular exercise- you look great! Call with any questions or concerns Stay Safe!  Stay Healthy! Happy Early Birthday!!! 

## 2024-05-28 NOTE — Assessment & Plan Note (Signed)
 Pt's PE WNL.  UTD on pap, mammo, colonoscopy.  Going to check w/ Employee health regarding Tdap.  Check labs.  Anticipatory guidance provided.

## 2024-05-28 NOTE — Progress Notes (Signed)
   Subjective:    Patient ID: Sierra Harris, female    DOB: 03/30/1971, 53 y.o.   MRN: [id number]  HPI CPE- UTD on pap, mammo, colonoscopy.  Due for Tdap- declines.  Patient Care Team    Relationship Specialty Notifications Start End  Mahlon Comer BRAVO, MD PCP - General Family Medicine  03/13/14     Health Maintenance  Topic Date Due   HIV Screening  Never done   Hepatitis C Screening  Never done   Hepatitis B Vaccines (1 of 3 - 19+ 3-dose series) Never done   Zoster Vaccines- Shingrix (1 of 2) Never done   DTaP/Tdap/Td (2 - Td or Tdap) 03/16/2024   INFLUENZA VACCINE  06/20/2024   MAMMOGRAM  07/08/2025   Cervical Cancer Screening (HPV/Pap Cotest)  03/28/2026   Colonoscopy  08/22/2033   HPV VACCINES  Aged Out   Meningococcal B Vaccine  Aged Out   COVID-19 Vaccine  Discontinued      Review of Systems Patient reports no vision/ hearing changes, adenopathy,fever, weight change,  persistant/recurrent hoarseness , swallowing issues, chest pain, palpitations, edema, persistant/recurrent cough, hemoptysis, dyspnea (rest/exertional/paroxysmal nocturnal), gastrointestinal bleeding (melena, rectal bleeding), abdominal pain, significant heartburn, bowel changes, GU symptoms (dysuria, hematuria, incontinence), Gyn symptoms (abnormal  bleeding, pain),  syncope, focal weakness, memory loss, numbness & tingling, skin/hair/nail changes, abnormal bruising or bleeding, anxiety, or depression.     Objective:   Physical Exam General Appearance:    Alert, cooperative, no distress, appears stated age  Head:    Normocephalic, without obvious abnormality, atraumatic  Eyes:    PERRL, conjunctiva/corneas clear, EOM's intact both eyes  Ears:    Normal TM's and external ear canals, both ears  Nose:   Nares normal, septum midline, mucosa normal, no drainage    or sinus tenderness  Throat:   Lips, mucosa, and tongue normal; teeth and gums normal  Neck:   Supple, symmetrical, trachea midline, no  adenopathy;    Thyroid : no enlargement/tenderness/nodules  Back:     Symmetric, no curvature, ROM normal, no CVA tenderness  Lungs:     Clear to auscultation bilaterally, respirations unlabored  Chest Wall:    No tenderness or deformity   Heart:    Regular rate and rhythm, S1 and S2 normal, no murmur, rub   or gallop  Breast Exam:    Deferred to mammo  Abdomen:     Soft, non-tender, bowel sounds active all four quadrants,    no masses, no organomegaly  Genitalia:    Deferred  Rectal:    Extremities:   Extremities normal, atraumatic, no cyanosis or edema  Pulses:   2+ and symmetric all extremities  Skin:   Skin color, texture, turgor normal, no rashes or lesions  Lymph nodes:   Cervical, supraclavicular, and axillary nodes normal  Neurologic:   CNII-XII intact, normal strength, sensation and reflexes    throughout          Assessment & Plan:

## 2024-06-07 ENCOUNTER — Other Ambulatory Visit: Payer: Self-pay | Admitting: Family Medicine

## 2024-06-09 NOTE — Telephone Encounter (Signed)
 RF request for Xanax  0.5mg  tablet LOV: 05/28/24 Next ov: not shceduled Last written: 11/13/23 30 tablets with 3 refills

## 2024-06-13 ENCOUNTER — Other Ambulatory Visit: Payer: Self-pay | Admitting: Family Medicine

## 2024-06-13 DIAGNOSIS — Z1231 Encounter for screening mammogram for malignant neoplasm of breast: Secondary | ICD-10-CM

## 2024-07-09 ENCOUNTER — Ambulatory Visit
Admission: RE | Admit: 2024-07-09 | Discharge: 2024-07-09 | Disposition: A | Discharge status: Home / Self Care | Payer: PRIVATE HEALTH INSURANCE | Source: Ambulatory Visit | Attending: Family Medicine | Admitting: Family Medicine

## 2024-07-09 DIAGNOSIS — Z1231 Encounter for screening mammogram for malignant neoplasm of breast: Secondary | ICD-10-CM

## 2024-07-24 ENCOUNTER — Encounter: Payer: PRIVATE HEALTH INSURANCE | Admitting: Family Medicine

## 2024-10-24 ENCOUNTER — Encounter: Payer: Self-pay | Admitting: Family Medicine
